# Patient Record
Sex: Male | Born: 1970 | Race: White | Hispanic: No | Marital: Single | Smoking: Current every day smoker
Health system: Southern US, Community
[De-identification: ages and names within clinical notes are randomized; demographics above are authoritative.]

## PROBLEM LIST (undated history)

## (undated) DIAGNOSIS — R51 Headache: Secondary | ICD-10-CM

## (undated) DIAGNOSIS — I10 Essential (primary) hypertension: Secondary | ICD-10-CM

## (undated) DIAGNOSIS — M1990 Unspecified osteoarthritis, unspecified site: Secondary | ICD-10-CM

## (undated) DIAGNOSIS — R519 Headache, unspecified: Secondary | ICD-10-CM

## (undated) DIAGNOSIS — F329 Major depressive disorder, single episode, unspecified: Secondary | ICD-10-CM

## (undated) DIAGNOSIS — R569 Unspecified convulsions: Secondary | ICD-10-CM

## (undated) DIAGNOSIS — F32A Depression, unspecified: Secondary | ICD-10-CM

## (undated) HISTORY — PX: BACK SURGERY: SHX140

## (undated) HISTORY — DX: Unspecified osteoarthritis, unspecified site: M19.90

## (undated) HISTORY — DX: Unspecified convulsions: R56.9

## (undated) HISTORY — PX: SHOULDER SURGERY: SHX246

## (undated) HISTORY — DX: Essential (primary) hypertension: I10

## (undated) HISTORY — DX: Headache: R51

## (undated) HISTORY — DX: Depression, unspecified: F32.A

## (undated) HISTORY — DX: Headache, unspecified: R51.9

## (undated) HISTORY — DX: Major depressive disorder, single episode, unspecified: F32.9

---

## 2007-01-12 ENCOUNTER — Emergency Department (HOSPITAL_COMMUNITY): Admission: EM | Admit: 2007-01-12 | Discharge: 2007-01-12 | Payer: Self-pay | Admitting: Family Medicine

## 2007-01-14 ENCOUNTER — Encounter: Admission: RE | Admit: 2007-01-14 | Discharge: 2007-01-14 | Payer: Self-pay | Admitting: Orthopedic Surgery

## 2007-01-14 ENCOUNTER — Emergency Department (HOSPITAL_COMMUNITY): Admission: EM | Admit: 2007-01-14 | Discharge: 2007-01-15 | Payer: Self-pay | Admitting: *Deleted

## 2012-11-24 ENCOUNTER — Ambulatory Visit: Payer: Self-pay

## 2012-12-10 DIAGNOSIS — I10 Essential (primary) hypertension: Secondary | ICD-10-CM

## 2012-12-10 HISTORY — DX: Essential (primary) hypertension: I10

## 2013-01-28 ENCOUNTER — Emergency Department: Payer: Self-pay

## 2013-01-28 LAB — COMPREHENSIVE METABOLIC PANEL
BUN: 7 mg/dL (ref 7–18)
Bilirubin,Total: 0.5 mg/dL (ref 0.2–1.0)
Chloride: 106 mmol/L (ref 98–107)
Co2: 31 mmol/L (ref 21–32)
Creatinine: 1.06 mg/dL (ref 0.60–1.30)
EGFR (African American): >60
EGFR (Non-African Amer.): >60
Glucose: 125 mg/dL — ABNORMAL HIGH (ref 65–99)
Osmolality: 283 (ref 275–301)
Potassium: 4.4 mmol/L (ref 3.5–5.1)

## 2013-01-28 LAB — DRUG SCREEN, URINE
Amphetamines, Ur Screen: NEGATIVE (ref ?–1000)
Barbiturates, Ur Screen: NEGATIVE (ref ?–200)
Cannabinoid 50 Ng, Ur ~~LOC~~: NEGATIVE (ref ?–50)
MDMA (Ecstasy)Ur Screen: NEGATIVE (ref ?–500)
Methadone, Ur Screen: NEGATIVE (ref ?–300)
Tricyclic, Ur Screen: NEGATIVE (ref ?–1000)

## 2013-01-28 LAB — CBC
HCT: 42.9 % (ref 40.0–52.0)
HGB: 14.6 g/dL (ref 13.0–18.0)
MCH: 33 pg (ref 26.0–34.0)
MCV: 97 fL (ref 80–100)
Platelet: 257 10*3/uL (ref 150–440)
RBC: 4.41 10*6/uL (ref 4.40–5.90)
RDW: 15.1 % — ABNORMAL HIGH (ref 11.5–14.5)

## 2013-01-28 LAB — URINALYSIS, COMPLETE
Bilirubin,UR: NEGATIVE
Blood: NEGATIVE
Leukocyte Esterase: NEGATIVE
Squamous Epithelial: NONE SEEN
WBC UR: NONE SEEN /HPF (ref 0–5)

## 2013-01-30 ENCOUNTER — Emergency Department: Payer: Self-pay | Admitting: Emergency Medicine

## 2013-02-17 ENCOUNTER — Ambulatory Visit: Payer: Self-pay | Admitting: Neurology

## 2013-02-20 ENCOUNTER — Ambulatory Visit: Payer: Self-pay | Admitting: Neurology

## 2013-04-29 ENCOUNTER — Ambulatory Visit: Payer: Self-pay

## 2013-09-04 ENCOUNTER — Emergency Department (HOSPITAL_COMMUNITY)
Admission: EM | Admit: 2013-09-04 | Discharge: 2013-09-04 | Payer: Managed Care, Other (non HMO) | Attending: Emergency Medicine | Admitting: Emergency Medicine

## 2013-09-04 ENCOUNTER — Encounter (HOSPITAL_COMMUNITY): Payer: Self-pay

## 2013-09-04 DIAGNOSIS — F329 Major depressive disorder, single episode, unspecified: Secondary | ICD-10-CM

## 2013-09-04 DIAGNOSIS — F17200 Nicotine dependence, unspecified, uncomplicated: Secondary | ICD-10-CM | POA: Insufficient documentation

## 2013-09-04 DIAGNOSIS — S61509A Unspecified open wound of unspecified wrist, initial encounter: Secondary | ICD-10-CM | POA: Insufficient documentation

## 2013-09-04 DIAGNOSIS — IMO0002 Reserved for concepts with insufficient information to code with codable children: Secondary | ICD-10-CM | POA: Insufficient documentation

## 2013-09-04 DIAGNOSIS — X838XXA Intentional self-harm by other specified means, initial encounter: Secondary | ICD-10-CM

## 2013-09-04 DIAGNOSIS — S61519A Laceration without foreign body of unspecified wrist, initial encounter: Secondary | ICD-10-CM

## 2013-09-04 DIAGNOSIS — Z0289 Encounter for other administrative examinations: Secondary | ICD-10-CM | POA: Insufficient documentation

## 2013-09-04 DIAGNOSIS — F39 Unspecified mood [affective] disorder: Secondary | ICD-10-CM

## 2013-09-04 DIAGNOSIS — X789XXA Intentional self-harm by unspecified sharp object, initial encounter: Secondary | ICD-10-CM | POA: Insufficient documentation

## 2013-09-04 DIAGNOSIS — R45851 Suicidal ideations: Secondary | ICD-10-CM | POA: Insufficient documentation

## 2013-09-04 LAB — CBC WITH DIFFERENTIAL/PLATELET
Basophils Absolute: 0.1 10*3/uL (ref 0.0–0.1)
Hemoglobin: 14.5 g/dL (ref 13.0–17.0)
MCH: 33.6 pg (ref 26.0–34.0)
MCHC: 34.4 g/dL (ref 30.0–36.0)
Neutro Abs: 3.7 10*3/uL (ref 1.7–7.7)
Neutrophils Relative %: 53 % (ref 43–77)
Platelets: 261 10*3/uL (ref 150–400)
RBC: 4.32 MIL/uL (ref 4.22–5.81)
WBC: 7 10*3/uL (ref 4.0–10.5)

## 2013-09-04 LAB — RAPID URINE DRUG SCREEN, HOSP PERFORMED
Amphetamines: NOT DETECTED
Barbiturates: NOT DETECTED
Cocaine: NOT DETECTED
Opiates: POSITIVE — AB

## 2013-09-04 LAB — POCT I-STAT, CHEM 8
Calcium, Ion: 1.11 mmol/L — ABNORMAL LOW (ref 1.12–1.23)
Glucose, Bld: 86 mg/dL (ref 70–99)
Potassium: 3.7 mEq/L (ref 3.5–5.1)
Sodium: 145 mEq/L (ref 135–145)
TCO2: 24 mmol/L (ref 0–100)

## 2013-09-04 MED ORDER — IBUPROFEN 800 MG PO TABS
800.0000 mg | ORAL_TABLET | Freq: Once | ORAL | Status: DC
  Filled 2013-09-04: qty 1

## 2013-09-04 MED ORDER — ZIPRASIDONE MESYLATE 20 MG IM SOLR: 20.0000 mg | Freq: Once | INTRAMUSCULAR | Status: DC

## 2013-09-04 MED ORDER — NICOTINE 21 MG/24HR TD PT24: 21.0000 mg | MEDICATED_PATCH | Freq: Every day | TRANSDERMAL | Status: DC

## 2013-09-04 MED ORDER — BACITRACIN ZINC 500 UNIT/GM EX OINT
1.0000 "application " | TOPICAL_OINTMENT | Freq: Two times a day (BID) | CUTANEOUS | Status: DC
  Administered 2013-09-04: 1 via TOPICAL
  Filled 2013-09-04: qty 0.9

## 2013-09-04 MED ORDER — LORAZEPAM 1 MG PO TABS: 1.0000 mg | ORAL_TABLET | Freq: Three times a day (TID) | ORAL | Status: DC | PRN

## 2013-09-04 MED ORDER — IBUPROFEN 200 MG PO TABS: 600.0000 mg | ORAL_TABLET | Freq: Three times a day (TID) | ORAL | Status: DC | PRN

## 2013-09-04 MED ORDER — ALUM & MAG HYDROXIDE-SIMETH 200-200-20 MG/5ML PO SUSP: 30.0000 mL | ORAL | Status: DC | PRN

## 2013-09-04 MED ORDER — ZOLPIDEM TARTRATE 5 MG PO TABS: 5.0000 mg | ORAL_TABLET | Freq: Every evening | ORAL | Status: DC | PRN

## 2013-09-04 MED ORDER — ONDANSETRON HCL 4 MG PO TABS: 4.0000 mg | ORAL_TABLET | Freq: Three times a day (TID) | ORAL | Status: DC | PRN

## 2013-09-04 MED ORDER — ACETAMINOPHEN 325 MG PO TABS: 650.0000 mg | ORAL_TABLET | ORAL | Status: DC | PRN

## 2013-09-04 NOTE — ED Provider Notes (Signed)
CSN: [id number]     Arrival date & time 09/04/13  0508 History   First MD Initiated Contact with Patient 09/04/13 [phone]     Chief Complaint  Patient presents with  . Medical Clearance  . Suicidal   (Consider location/radiation/quality/duration/timing/severity/associated sxs/prior Treatment) HPI Comments: Patient was brought in by [facility] Department with IVC papers that stage.  He is suicidal.  He locked himself in his home arms with weapons.  He has a laceration to his left wrist, approximately 3 cm long.  He has alcohol on his breath.  He, states it's all a misunderstanding.  The history is provided by the patient.    History reviewed. No pertinent past medical history. History reviewed. No pertinent past surgical history. History reviewed. No pertinent family history. History  Substance Use Topics  . Smoking status: Current Every Day Smoker  . Smokeless tobacco: Not on file  . Alcohol Use: Yes    Review of Systems  Constitutional: Negative for fever.  HENT: Positive for dental problem.   Skin: Positive for wound.  Neurological: Negative for dizziness, numbness and headaches.  All other systems reviewed and are negative.    Allergies  Review of patient's allergies indicates no known allergies.  Home Medications  No current outpatient prescriptions on file. BP 138/99  Pulse 100  Temp(Src) 98.9 F (37.2 C) (Oral)  Resp 18  SpO2 97% Physical Exam  Constitutional: He is oriented to person, place, and time. He appears well-developed and well-nourished.  HENT:  Head: Normocephalic.  Eyes: Pupils are equal, round, and reactive to light.  Neck: Normal range of motion.  Cardiovascular: Normal rate and regular rhythm.   Pulmonary/Chest: Effort normal.  Musculoskeletal: Normal range of motion. He exhibits tenderness. He exhibits no edema.  Neurological: He is alert and oriented to person, place, and time.  Skin: Skin is warm and dry.  Superficial scratch wounds to neck  of the ear bilaterally.  Laceration to the medial aspect of the left wrist, approximately 3 cm    ED Course  LACERATION REPAIR Date/Time: 09/04/2013 5:53 AM Performed by: Arman Filter Authorized by: Arman Filter Consent: Verbal consent obtained. written consent obtained. Risks and benefits: risks, benefits and alternatives were discussed Consent given by: patient Patient understanding: patient states understanding of the procedure being performed Patient identity confirmed: verbally with patient Body area: upper extremity Location details: left wrist Laceration length: 3 cm Foreign bodies: unknown Tendon involvement: none Nerve involvement: none Vascular damage: no Anesthesia: local infiltration Local anesthetic: lidocaine 1% without epinephrine Anesthetic total: 2 ml Patient sedated: no Preparation: Patient was prepped and draped in the usual sterile fashion. Irrigation solution: saline Irrigation method: syringe Amount of cleaning: standard Debridement: none Degree of undermining: none Skin closure: 5-0 Prolene Number of sutures: 4 Technique: simple Approximation: loose Approximation difficulty: simple Dressing: antibiotic ointment and gauze roll Patient tolerance: Patient tolerated the procedure well with no immediate complications.   (including critical care time) Labs Review Labs Reviewed  CBC WITH DIFFERENTIAL  ETHANOL  URINE RAPID DRUG SCREEN (HOSP PERFORMED)   Imaging Review No results found.  MDM  No diagnosis found.  Laceration sutures medical clearing labs have been ordered Patient in no distress handcuffs to R wrist with Sheriff at bedside     Arman Filter, NP 09/04/13 [phone]

## 2013-09-04 NOTE — BHH Counselor (Signed)
Therapist met with pt to complete assessment.  Pt uncooperative, posturing, demanding to leave.  No AVH or delusions present, denies SI/HI.  Pt picked up by LE after barricading himself in home with firearms.  Pt has laceration on left wrist.  IVC states pt was suicidal at time of pick up and smelled of ETOH.  Therapist unable to complete assessment with pt. Ena Dawley, MSW, LCSW, Herbalist

## 2013-09-04 NOTE — ED Notes (Signed)
Pt resistive to care while on SAPPU. Refused to answer assessment questions and refused to sign for D/C. D/C'd to the custody of [facility] deputies r/t outstanding warrants

## 2013-09-04 NOTE — ED Notes (Signed)
Patient notified that he needs to provide urine and will be cathed if he can not urinate.

## 2013-09-04 NOTE — ED Provider Notes (Signed)
Pt angry, agitated, tried to leave, walked to exit door, verbally de-escalated by myself, Pt is IVC, Pt aware he is IVC, await [facility] recs. [id number]  Hurman Horn, MD 09/05/13 [phone]

## 2013-09-04 NOTE — ED Notes (Signed)
Pt belongings: pair of jeans, hoodie, belk, socks, sneakers

## 2013-09-04 NOTE — BH Assessment (Signed)
Patient accepted to [facility] by Dahlia Byes NP. Attending MD Akintayo, bed #405-2.

## 2013-09-04 NOTE — ED Provider Notes (Signed)
Patient care assumed from Earley Favor, FNP at shift change.  8:20 - Patient presents by IVC after attempted suicide by cop. Awaiting urine. Once medically cleared, plan TTS consult.  Urine positive for opiates and benzos. Patient medically cleared and consult to TTS placed.  8:51 - Patient c/o dental pain from broken tooth; ibuprofen ordered for pain control.  3:30 - Patient denying suicidal ideations. Patient seen by psych ED doctor, Dr. Fonnie Jarvis, who will d/c patient to jail.  Antony Madura, PA-C 09/04/13 1549

## 2013-09-04 NOTE — ED Provider Notes (Signed)
Medical screening examination/treatment/procedure(s) were performed by non-physician practitioner and as supervising physician I was immediately available for consultation/collaboration.   Sherrica Niehaus R Jillyn Stacey, MD 09/04/13 0559 

## 2013-09-04 NOTE — ED Notes (Signed)
Bed: YN82 Expected date: 09/04/13 Expected time: 4:56 AM Means of arrival: Police Comments: IVC, etoh

## 2013-09-04 NOTE — ED Notes (Signed)
Pt is IVC by [employer], he stated that he was suicidal and was locked in his house armed with rifles and knives, he said he was going to come out with a knife so that he would get shot by police. Sheriff's dept was at a stand off with him since 10 pm.

## 2013-09-04 NOTE — ED Notes (Signed)
Psych physician and PA talked with the patient.

## 2013-09-04 NOTE — ED Notes (Signed)
In and out cath not done. Patient voided.

## 2013-09-04 NOTE — Consult Note (Signed)
[facility] Face-to-Face Psychiatry Consult   Reason for Consult:  Suicide Attempt Referring Physician:  EDP Juan Avila is an 42 y.o. male.  Assessment: AXIS I:  Suicide attempt, Mood d/o AXIS II:  Deferred AXIS III:  History reviewed. No pertinent past medical history. AXIS IV:  housing problems, other psychosocial or environmental problems, problems related to social environment and problems with primary support group AXIS V:  21-30 behavior considerably influenced by delusions or hallucinations OR serious impairment in judgment, communication OR inability to function in almost all areas  Plan:  Patient does not meet criteria for psychiatric inpatient admission.  Subjective:   Juan Avila is a 42 y.o. male patient admitted with Suicide ideation/attempt.  HPI:   Caucasian male was brought in early this am by the [facility] under IVC paper for holding himself in a locked room with guns and knives.  Patient also cut himself on his left wrist as a suicidal gesture.  Patient was uncooperative during this interview and was not forth coming with his answers.  Initially he denies suicide attempt and stated he did not have a gun and later he said he cut himself to " end it all"  When asked to explain he said he just found out that his girl friend of two years was cheating on him and he could not handle that.  He denied locking himself up with guns around him.  He does not have any mental illness diagnosis and does not take any medications.  He stated he has 3 children and does not want to kill himself.  He declined an admission and started getting agitated.  We will discharge him to the [facility] who will take him to jail where he will continue to receive medical and psychiatric care.  He denies SI/HI/AVH.  Patient was very uncooperative with this interview. HPI Elements:   Location:  [facility].  Past Psychiatric History: History reviewed. No pertinent past medical history.  reports that he has been smoking.  He  does not have any smokeless tobacco history on file. He reports that  drinks alcohol. His drug history is not on file. History reviewed. No pertinent family history.         Allergies:  No Known Allergies  ACT Assessment Complete:  No:   Past Psychiatric History: Diagnosis:  Depressive d/o, Suicide Attenpt  Hospitalizations:  denies  Outpatient Care:  denies  Substance Abuse Care:  denies  Self-Mutilation:  denies  Suicidal Attempts:  Yes, last night  Homicidal Behaviors:  denies   Violent Behaviors:  denies   Place of Residence:  unknown Marital Status:  seperated Employed/Unemployed:  employed Education:  unknown Family Supports:  [place] Objective: Blood pressure 155/99, pulse 88, temperature 98 F (36.7 C), temperature source Oral, resp. rate 16, SpO2 98.00%.There is no height or weight on file to calculate BMI. Results for orders placed during the hospital encounter of 09/04/13 (from the past 72 hour(s))  CBC WITH DIFFERENTIAL     Status: None   Collection Time    09/04/13  6:17 AM      Result Value Range   WBC 7.0  4.0 - 10.5 K/uL   RBC 4.32  4.22 - 5.81 MIL/uL   Hemoglobin 14.5  13.0 - 17.0 g/dL   HCT 11.9  14.7 - 82.9 %   MCV 97.7  78.0 - 100.0 fL   MCH 33.6  26.0 - 34.0 pg   MCHC 34.4  30.0 - 36.0 g/dL   RDW 13.6  11.5 - 15.5 %   Platelets 261  150 - 400 K/uL   Neutrophils Relative % 53  43 - 77 %   Neutro Abs 3.7  1.7 - 7.7 K/uL   Lymphocytes Relative 35  12 - 46 %   Lymphs Abs 2.5  0.7 - 4.0 K/uL   Monocytes Relative 10  3 - 12 %   Monocytes Absolute 0.7  0.1 - 1.0 K/uL   Eosinophils Relative 2  0 - 5 %   Eosinophils Absolute 0.1  0.0 - 0.7 K/uL   Basophils Relative 1  0 - 1 %   Basophils Absolute 0.1  0.0 - 0.1 K/uL  ETHANOL     Status: Abnormal   Collection Time    09/04/13  6:17 AM      Result Value Range   Alcohol, Ethyl (B) 104 (*) 0 - 11 mg/dL   Comment:            LOWEST DETECTABLE LIMIT FOR     SERUM ALCOHOL IS 11 mg/dL     FOR MEDICAL  PURPOSES ONLY  POCT I-STAT, CHEM 8     Status: Abnormal   Collection Time    09/04/13  6:25 AM      Result Value Range   Sodium 145  135 - 145 mEq/L   Potassium 3.7  3.5 - 5.1 mEq/L   Chloride 106  96 - 112 mEq/L   BUN 4 (*) 6 - 23 mg/dL   Creatinine, Ser 7.82  0.50 - 1.35 mg/dL   Glucose, Bld 86  70 - 99 mg/dL   Calcium, Ion 9.56 (*) 1.12 - 1.23 mmol/L   TCO2 24  0 - 100 mmol/L   Hemoglobin 15.3  13.0 - 17.0 g/dL   HCT 21.3  08.6 - 57.8 %  URINE RAPID DRUG SCREEN (HOSP PERFORMED)     Status: Abnormal   Collection Time    09/04/13  7:41 AM      Result Value Range   Opiates POSITIVE (*) NONE DETECTED   Cocaine NONE DETECTED  NONE DETECTED   Benzodiazepines POSITIVE (*) NONE DETECTED   Amphetamines NONE DETECTED  NONE DETECTED   Tetrahydrocannabinol NONE DETECTED  NONE DETECTED   Barbiturates NONE DETECTED  NONE DETECTED   Comment:            DRUG SCREEN FOR MEDICAL PURPOSES     ONLY.  IF CONFIRMATION IS NEEDED     FOR ANY PURPOSE, NOTIFY LAB     WITHIN 5 DAYS.                LOWEST DETECTABLE LIMITS     FOR URINE DRUG SCREEN     Drug Class       Cutoff (ng/mL)     Amphetamine      1000     Barbiturate      200     Benzodiazepine   200     Tricyclics       300     Opiates          300     Cocaine          300     THC              50   Labs are reviewed and are pertinent for UDS POSITIVE BENZODIAZEPINE, OpIATES, Alcohol abuse  Current Facility-Administered Medications  Medication Dose Route Frequency Provider Last Rate Last Dose  . acetaminophen (TYLENOL) tablet 650  mg  650 mg Oral Q4H PRN Antony Madura, PA-C      . alum & mag hydroxide-simeth (MAALOX/MYLANTA) 200-200-20 MG/5ML suspension 30 mL  30 mL Oral PRN Antony Madura, PA-C      . bacitracin ointment 1 application  1 application Topical BID Arman Filter, NP   1 application at 09/04/13 [id number]  . ibuprofen (ADVIL,MOTRIN) tablet 600 mg  600 mg Oral Q8H PRN Antony Madura, PA-C      . ibuprofen (ADVIL,MOTRIN) tablet 800 mg   800 mg Oral Once [employer], PA-C      . LORazepam (ATIVAN) tablet 1 mg  1 mg Oral Q8H PRN Antony Madura, PA-C      . nicotine (NICODERM CQ - dosed in mg/24 hours) patch 21 mg  21 mg Transdermal Daily Antony Madura, PA-C      . ondansetron [facility]) tablet 4 mg  4 mg Oral Q8H PRN Antony Madura, PA-C      . ziprasidone (GEODON) injection 20 mg  20 mg Intramuscular Once Earney Navy, NP      . zolpidem (AMBIEN) tablet 5 mg  5 mg Oral QHS PRN Antony Madura, PA-C       No current outpatient prescriptions on file.    Psychiatric Specialty Exam:     Blood pressure 155/99, pulse 88, temperature 98 F (36.7 C), temperature source Oral, resp. rate 16, SpO2 98.00%.There is no height or weight on file to calculate BMI.  General Appearance: Casual  Eye Contact::  Poor  Speech:  Normal Rate  Volume:  Normal  Mood:  Angry, Anxious, Depressed, Hopeless, Irritable and Worthless  Affect:  Congruent  Thought Process:  Coherent and Goal Directed  Orientation:  Full (Time, Place, and Person)  Thought Content:  NA  Suicidal Thoughts:  Yes.  with intent/plan  Homicidal Thoughts:  No  Memory:  Immediate;   Good Recent;   Good Remote;   Good  Judgement:  Poor  Insight:  Shallow  Psychomotor Activity:  Normal  Concentration:  Fair  Recall:  Fair  Akathisia:  NA  Handed:  Right  AIMS (if indicated):     Assets:  Desire for Improvement  Sleep:      Treatment Plan Summary:  Consult and face to face interview with Dr Ladona Ridgel Patient declined admission to our inpatient unit and is d/c to the jail taken by the [facility] where he will receive psychiatric and medical care. Daily contact with patient to assess and evaluate symptoms and progress in treatment  Dahlia Byes, C  PMHNP-BC 09/04/2013 2:39 PM

## 2013-09-04 NOTE — ED Notes (Signed)
Pt has an active warrant against him for a possible probation violation

## 2013-09-05 NOTE — ED Provider Notes (Signed)
Medical screening examination/treatment/procedure(s) were conducted as a shared visit with non-physician practitioner(s) and myself.  I personally evaluated the patient during the encounter  Hurman Horn, MD 09/05/13 [phone]

## 2013-09-08 NOTE — Consult Note (Signed)
Note reviewed and agreed with  

## 2014-11-13 IMAGING — CT CT HEAD WITHOUT CONTRAST
1 series · 16 of 30 positions shown, 20 images · non-contrast
Comparison: none

REASON FOR EXAM: transient confusion, syncope, dropping things
COMMENTS:

[Series 2: soft tissue · axial · 0.42mm/px · z∈[-179,-44]mm · 16 of 31 slices shown, 20 images]
[im 2/31  brain]
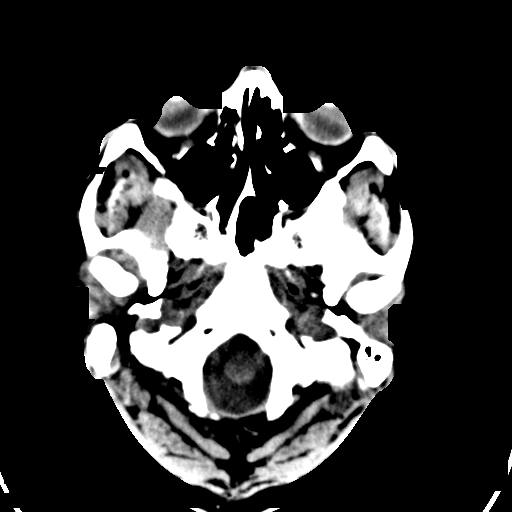
[im 2/31  bone]
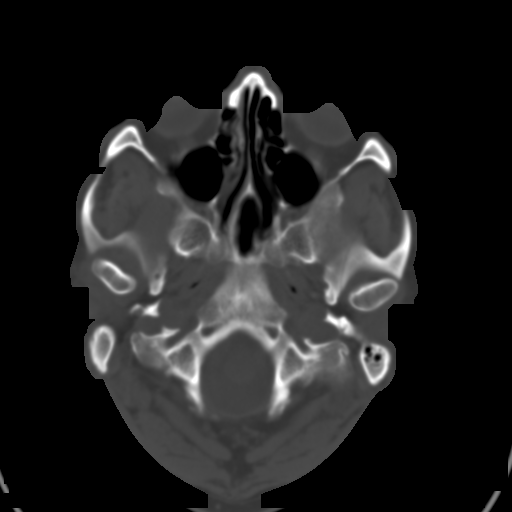
[im 4/31  brain]
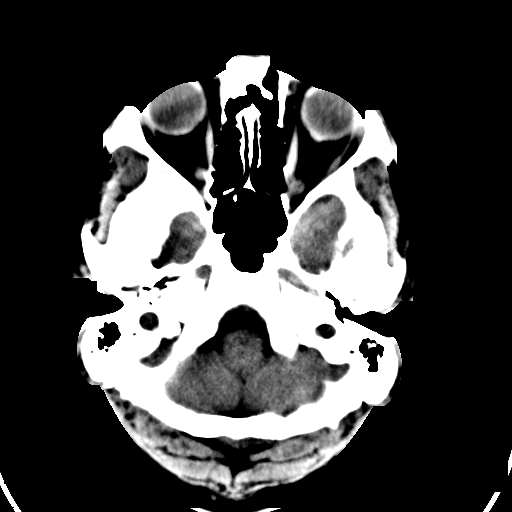
[im 6/31  brain]
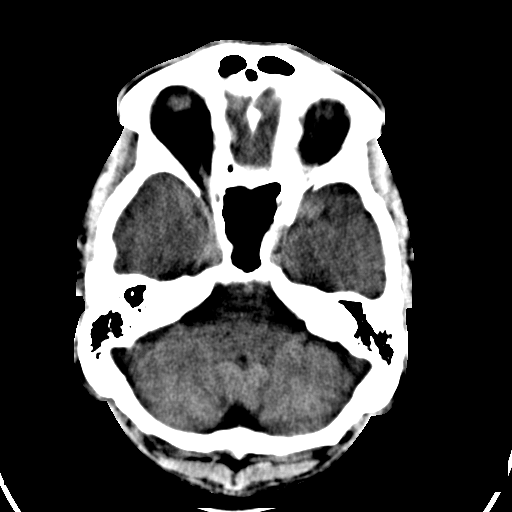
[im 8/31  brain]
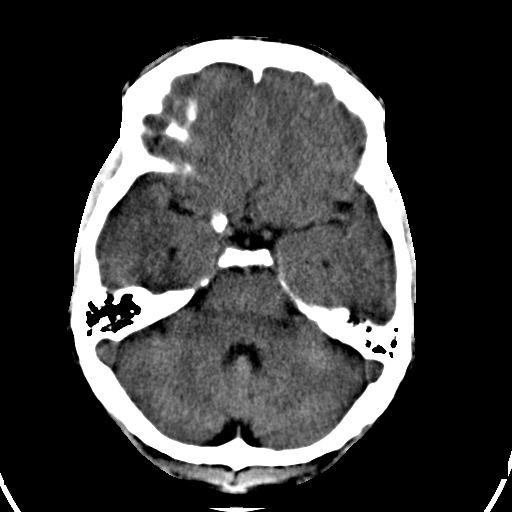
[im 9/31  brain]
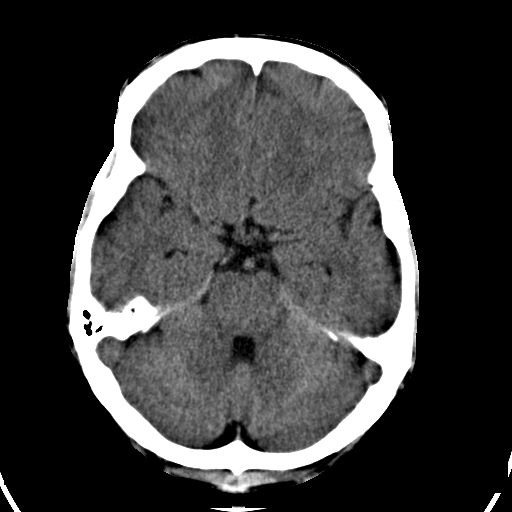
[im 9/31  bone]
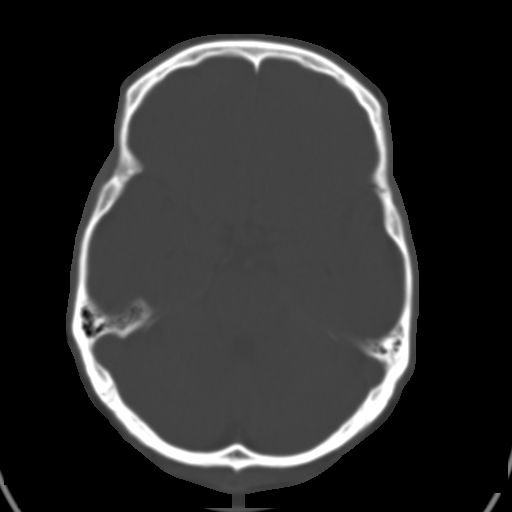
[im 11/31  brain]
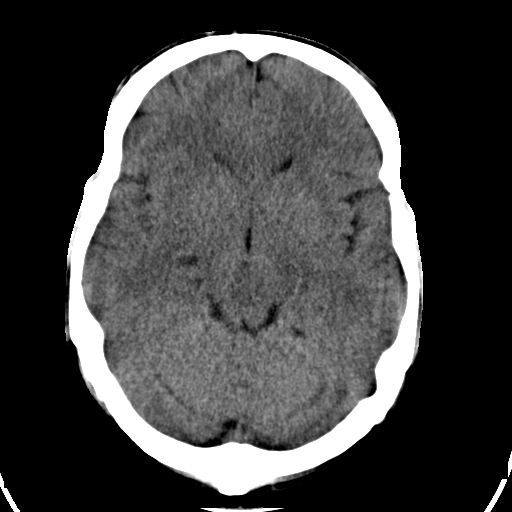
[im 13/31  brain]
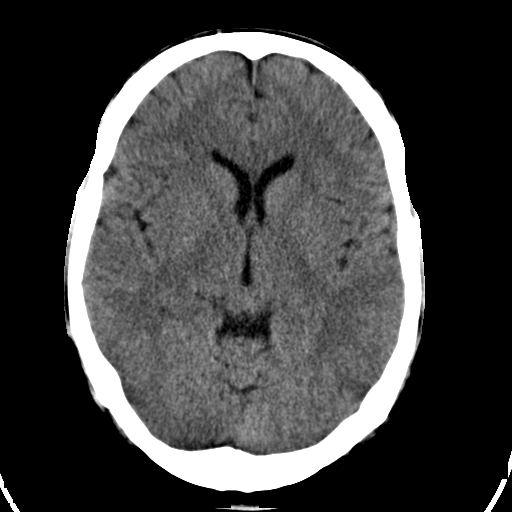
[im 15/31  brain]
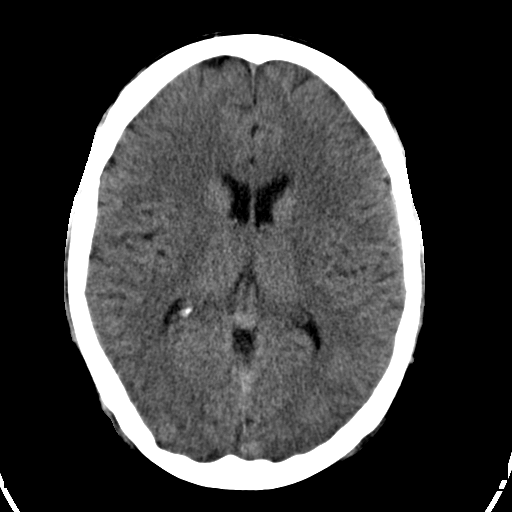
[im 16/31  brain]
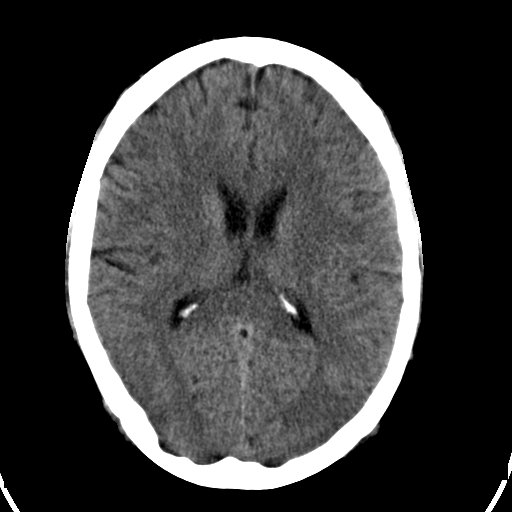
[im 16/31  bone]
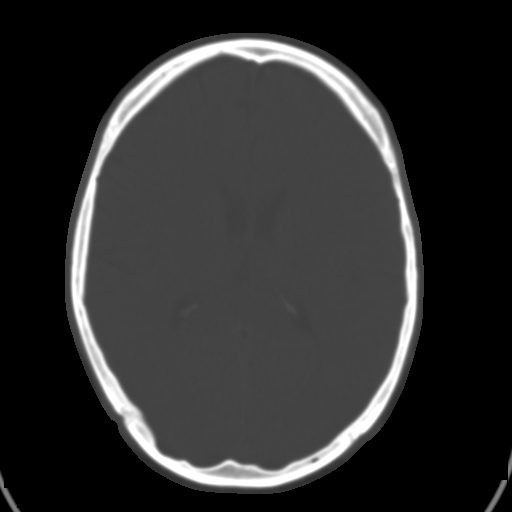
[im 18/31  brain]
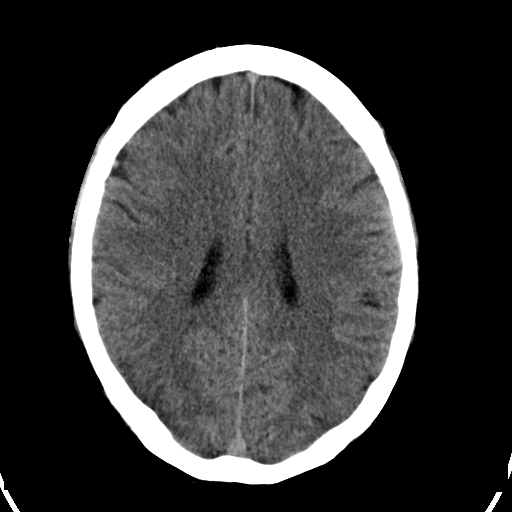
[im 20/31  brain]
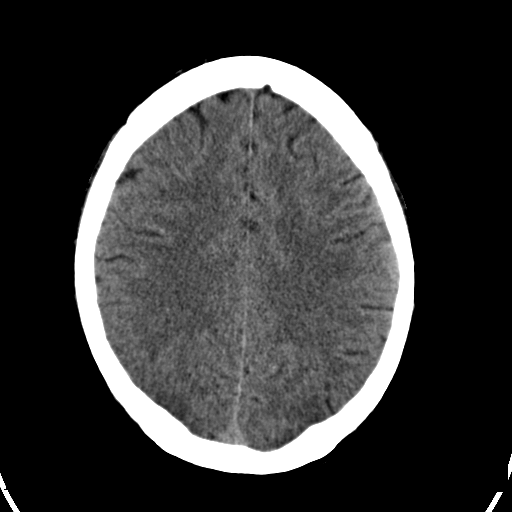
[im 22/31  brain]
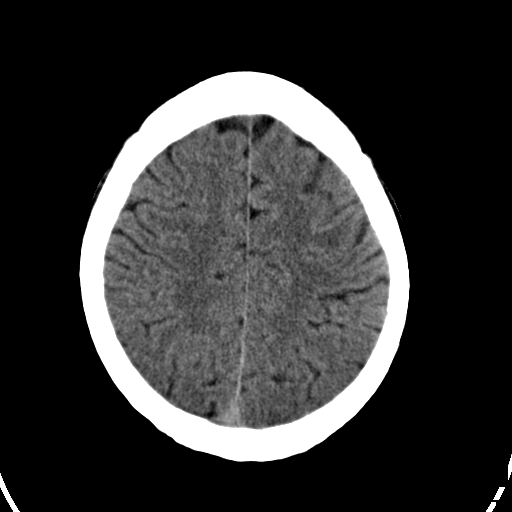
[im 23/31  brain]
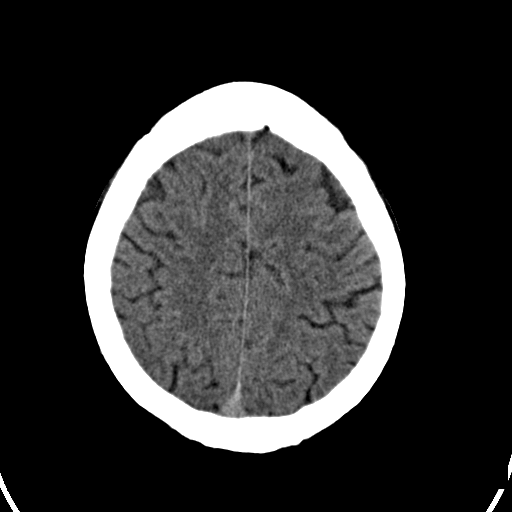
[im 23/31  bone]
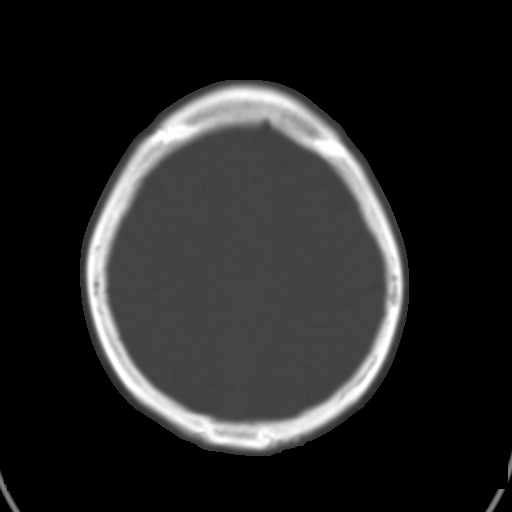
[im 25/31  brain]
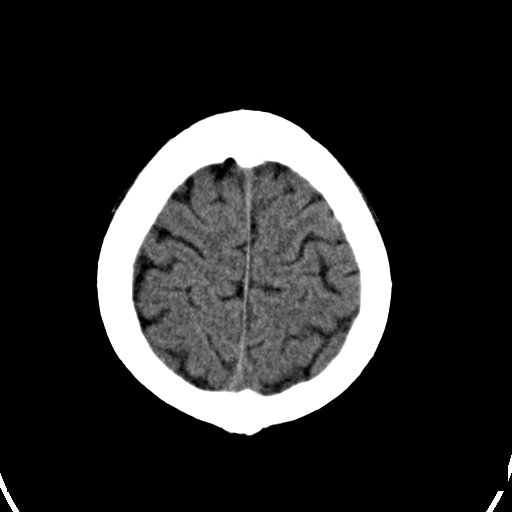
[im 27/31  brain]
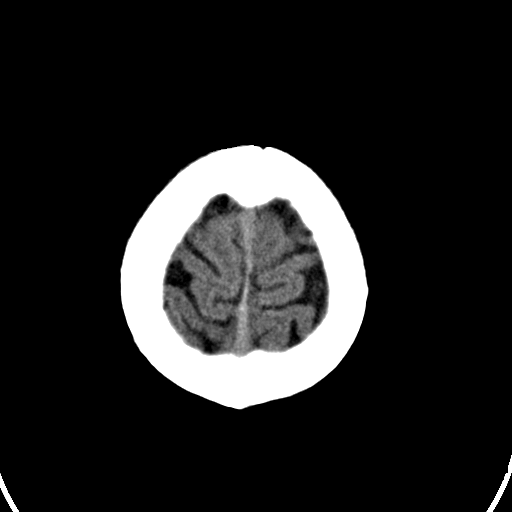
[im 29/31  brain]
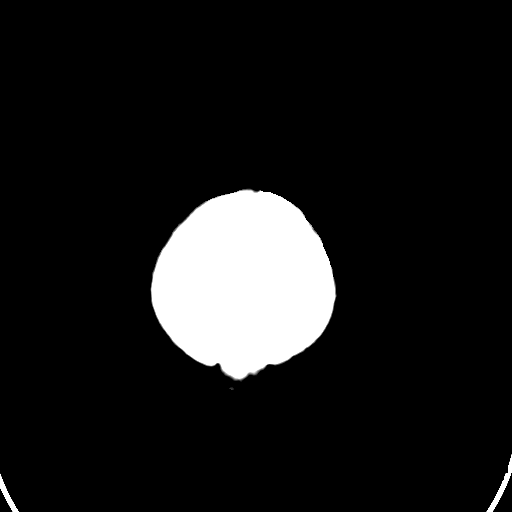

[16 of 30 positions shown; findings below may reference images not displayed]

PROCEDURE:     CT  - CT HEAD WITHOUT CONTRAST  - January 28, 2013  [DATE]

RESULT:     Noncontrast emergent CT of the brain is performed. The patient
has no previous exam for comparison.

The ventricles and sulci are normal. There is no hemorrhage. There is no
focal mass, mass-effect or midline shift. There is no evidence of edema or
territorial infarct. The bone windows demonstrate normal aeration of the
paranasal sinuses and mastoid air cells. There is no skull fracture
demonstrated.
IMPRESSION: 1. No acute intracranial abnormality.
2. Nonemergent followup MRI of the brain is recommended given the history .
The should be performed without and with gadolinium contra[REDACTED]

## 2014-11-13 IMAGING — CR DG CHEST 2V
1 series · 2 of 2 positions shown · non-contrast
Comparison: none

REASON FOR EXAM: pain R lower ribs s/p fall
COMMENTS:

[Series 1: w chest pa · 0.14mm/px · 2 of 2 slices shown]
[im 1/2]
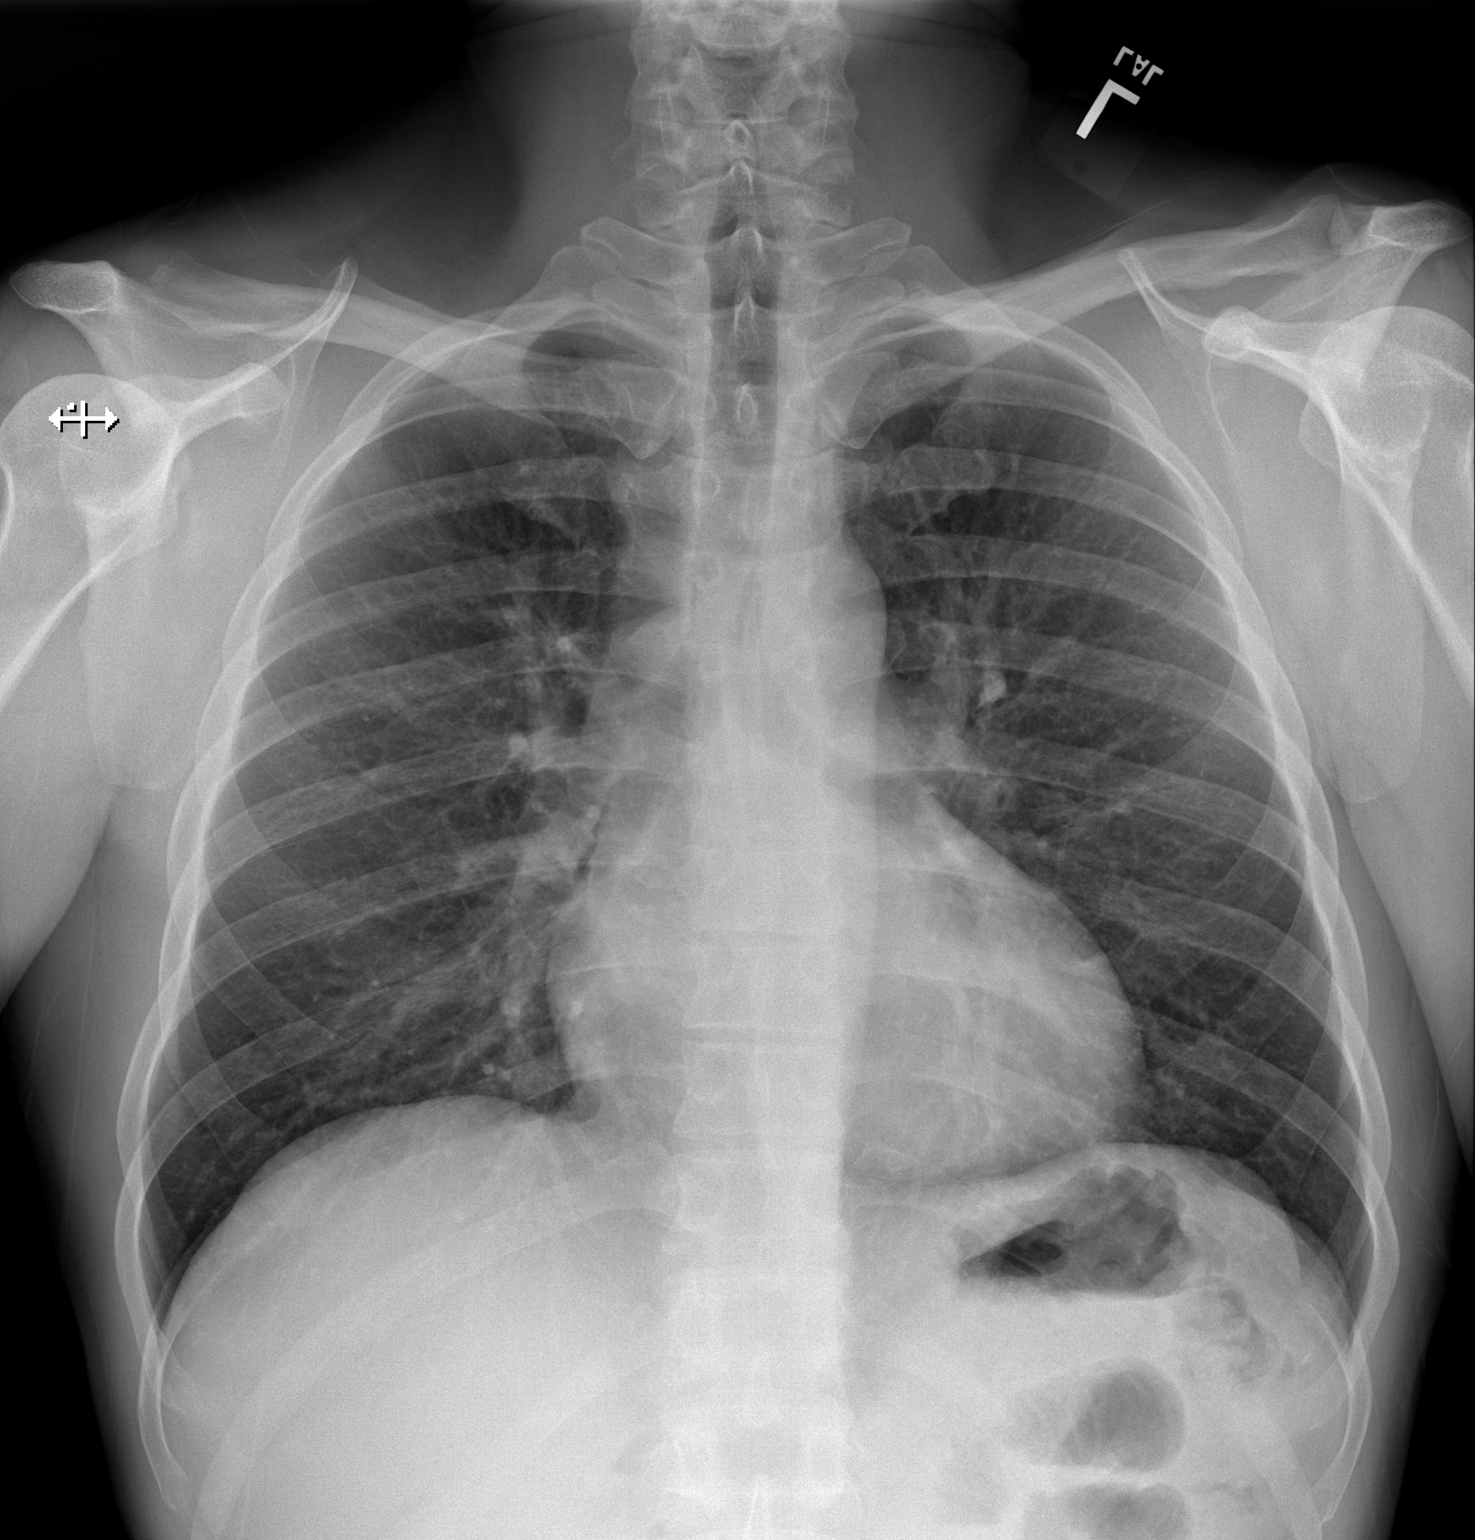
[im 2/2]
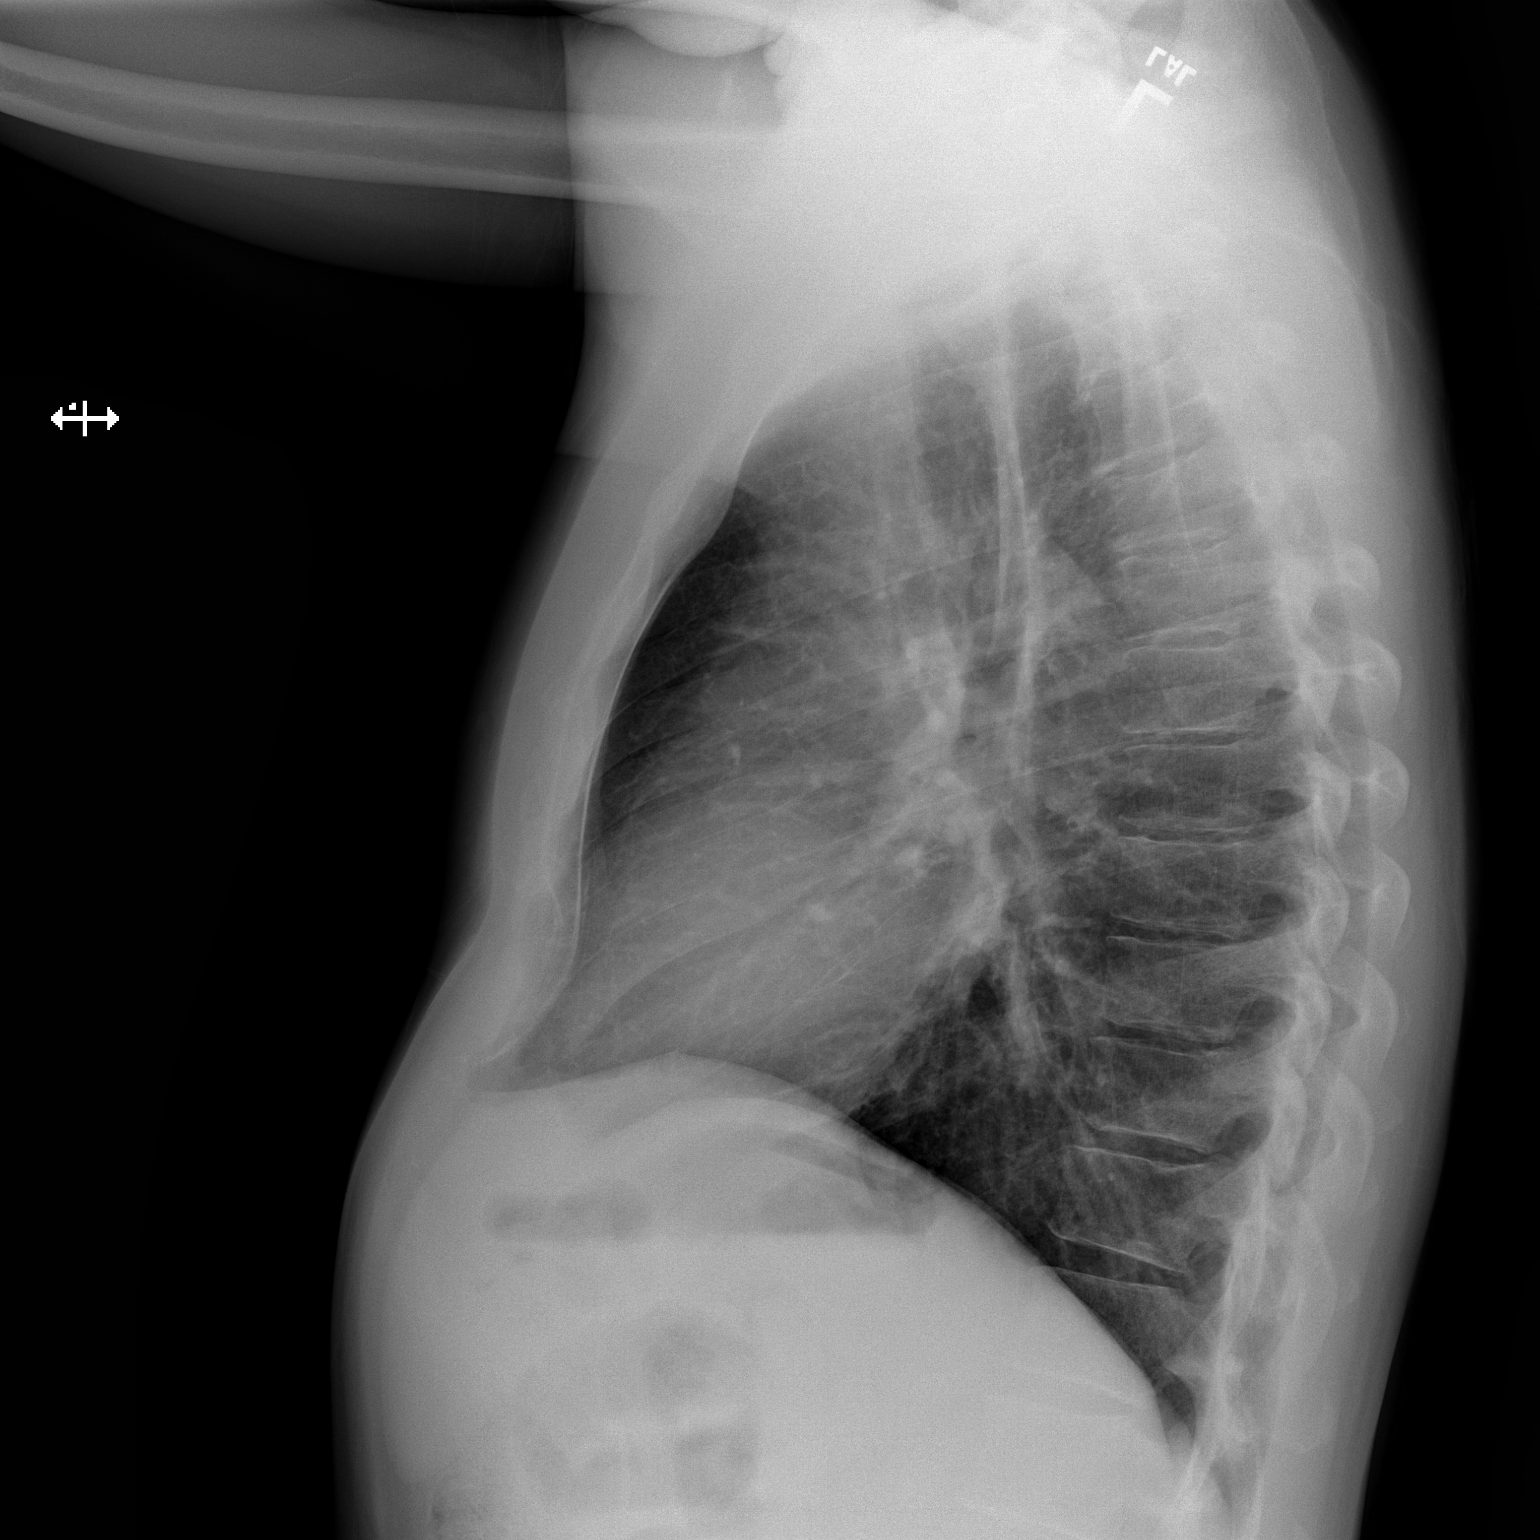

[2 of 2 positions shown; findings below may reference images not displayed]

PROCEDURE:     DXR - DXR CHEST PA (OR AP) AND LATERAL  - January 28, 2013  [DATE]

RESULT:     The lungs are well-expanded. There is no focal infiltrate or
evidence of a pulmonary contusion or pleural effusion. There is no
pneumothorax or pneumomediastinum. The cardiac silhouette is normal in size.
The pulmonary vascularity is not engorged. The bony thorax exhibits no acute
abnormality.
IMPRESSION: There is no evidence of acute cardiopulmonary abnormality.
The observed portions of the bony thorax appear intact.

[REDACTED]

## 2015-06-17 ENCOUNTER — Ambulatory Visit (INDEPENDENT_AMBULATORY_CARE_PROVIDER_SITE_OTHER): Payer: Self-pay | Admitting: Physician Assistant

## 2015-06-17 ENCOUNTER — Encounter: Payer: Self-pay | Admitting: Physician Assistant

## 2015-06-17 VITALS — BP 160/100 | HR 82 | Temp 98.5°F | Resp 18 | Ht 70.0 in | Wt 162.2 lb

## 2015-06-17 DIAGNOSIS — I10 Essential (primary) hypertension: Secondary | ICD-10-CM | POA: Insufficient documentation

## 2015-06-17 DIAGNOSIS — Z720 Tobacco use: Secondary | ICD-10-CM | POA: Insufficient documentation

## 2015-06-17 MED ORDER — HYDROCHLOROTHIAZIDE 12.5 MG PO TABS: 12.5000 mg | ORAL_TABLET | Freq: Every day | ORAL | Status: DC

## 2015-06-17 NOTE — Patient Instructions (Signed)
Low-Sodium Eating Plan Sodium raises blood pressure and causes water to be held in the body. Getting less sodium from food will help lower your blood pressure, reduce any swelling, and protect your heart, liver, and kidneys. We get sodium by adding salt (sodium chloride) to food. Most of our sodium comes from canned, boxed, and frozen foods. Restaurant foods, fast foods, and pizza are also very high in sodium. Even if you take medicine to lower your blood pressure or to reduce fluid in your body, getting less sodium from your food is important. WHAT IS MY PLAN? Most people should limit their sodium intake to 2,300 mg a day. Your health care provider recommends that you limit your sodium intake to __________ a day.  WHAT DO I NEED TO KNOW ABOUT THIS EATING PLAN? For the low-sodium eating plan, you will follow these general guidelines:  Choose foods with a % Daily Value for sodium of less than 5% (as listed on the food label).   Use salt-free seasonings or herbs instead of table salt or sea salt.   Check with your health care provider or pharmacist before using salt substitutes.   Eat fresh foods.  Eat more vegetables and fruits.  Limit canned vegetables. If you do use them, rinse them well to decrease the sodium.   Limit cheese to 1 oz (28 g) per day.   Eat lower-sodium products, often labeled as "lower sodium" or "no salt added."  Avoid foods that contain monosodium glutamate (MSG). MSG is sometimes added to [place] food and some canned foods.  Check food labels (Nutrition Facts labels) on foods to learn how much sodium is in one serving.  Eat more home-cooked food and less restaurant, buffet, and fast food.  When eating at a restaurant, ask that your food be prepared with less salt or none, if possible.  HOW DO I READ FOOD LABELS FOR SODIUM INFORMATION? The Nutrition Facts label lists the amount of sodium in one serving of the food. If you eat more than one serving, you must  multiply the listed amount of sodium by the number of servings. Food labels may also identify foods as:  Sodium free--Less than 5 mg in a serving.  Very low sodium--35 mg or less in a serving.  Low sodium--140 mg or less in a serving.  Light in sodium--50% less sodium in a serving. For example, if a food that usually has 300 mg of sodium is changed to become light in sodium, it will have 150 mg of sodium.  Reduced sodium--25% less sodium in a serving. For example, if a food that usually has 400 mg of sodium is changed to reduced sodium, it will have 300 mg of sodium. WHAT FOODS CAN I EAT? Grains Low-sodium cereals, including oats, puffed wheat and rice, and shredded wheat cereals. Low-sodium crackers. Unsalted rice and pasta. Lower-sodium bread.  Vegetables Frozen or fresh vegetables. Low-sodium or reduced-sodium canned vegetables. Low-sodium or reduced-sodium tomato sauce and paste. Low-sodium or reduced-sodium tomato and vegetable juices.  Fruits Fresh, frozen, and canned fruit. Fruit juice.  Meat and Other Protein Products Low-sodium canned tuna and salmon. Fresh or frozen meat, poultry, seafood, and fish. Lamb. Unsalted nuts. Dried beans, peas, and lentils without added salt. Unsalted canned beans. Homemade soups without salt. Eggs.  Dairy Milk. Soy milk. Ricotta cheese. Low-sodium or reduced-sodium cheeses. Yogurt.  Condiments Fresh and dried herbs and spices. Salt-free seasonings. Onion and garlic powders. Low-sodium varieties of mustard and ketchup. Lemon juice.  Fats and Oils  Reduced-sodium salad dressings. Unsalted butter.  Other Unsalted popcorn and pretzels.  The items listed above may not be a complete list of recommended foods or beverages. Contact your dietitian for more options. WHAT FOODS ARE NOT RECOMMENDED? Grains Instant hot cereals. Bread stuffing, pancake, and biscuit mixes. Croutons. Seasoned rice or pasta mixes. Noodle soup cups. Boxed or frozen  macaroni and cheese. Self-rising flour. Regular salted crackers. Vegetables Regular canned vegetables. Regular canned tomato sauce and paste. Regular tomato and vegetable juices. Frozen vegetables in sauces. Salted french fries. Olives. Rosita Fire. Relishes. Sauerkraut. Salsa. Meat and Other Protein Products Salted, canned, smoked, spiced, or pickled meats, seafood, or fish. Bacon, ham, sausage, hot dogs, corned beef, chipped beef, and packaged luncheon meats. Salt pork. Jerky. Pickled herring. Anchovies, regular canned tuna, and sardines. Salted nuts. Dairy Processed cheese and cheese spreads. Cheese curds. Blue cheese and cottage cheese. Buttermilk.  Condiments Onion and garlic salt, seasoned salt, table salt, and sea salt. Canned and packaged gravies. Worcestershire sauce. Tartar sauce. Barbecue sauce. Teriyaki sauce. Soy sauce, including reduced sodium. Steak sauce. Fish sauce. Oyster sauce. Cocktail sauce. Horseradish. Regular ketchup and mustard. Meat flavorings and tenderizers. Bouillon cubes. Hot sauce. Tabasco sauce. Marinades. Taco seasonings. Relishes. Fats and Oils Regular salad dressings. Salted butter. Margarine. Ghee. Bacon fat.  Other Potato and tortilla chips. Corn chips and puffs. Salted popcorn and pretzels. Canned or dried soups. Pizza. Frozen entrees and pot pies.  The items listed above may not be a complete list of foods and beverages to avoid. Contact your dietitian for more information. Document Released: 05/18/2002 Document Revised: 12/01/2013 Document Reviewed: 09/30/2013 [facility] Patient Information 2015 [place], [place]. This information is not intended to replace advice given to you by your health care provider. Make sure you discuss any questions you have with your health care provider. Hypertension Hypertension, commonly called high blood pressure, is when the force of blood pumping through your arteries is too strong. Your arteries are the blood vessels that  carry blood from your heart throughout your body. A blood pressure reading consists of a higher number over a lower number, such as 110/72. The higher number (systolic) is the pressure inside your arteries when your heart pumps. The lower number (diastolic) is the pressure inside your arteries when your heart relaxes. Ideally you want your blood pressure below 120/80. Hypertension forces your heart to work harder to pump blood. Your arteries may become narrow or stiff. Having hypertension puts you at risk for heart disease, stroke, and other problems.  RISK FACTORS Some risk factors for high blood pressure are controllable. Others are not.  Risk factors you cannot control include:   Race. You may be at higher risk if you are African American.  Age. Risk increases with age.  Gender. Men are at higher risk than women before age 37 years. After age 57, women are at higher risk than men. Risk factors you can control include:  Not getting enough exercise or physical activity.  Being overweight.  Getting too much fat, sugar, calories, or salt in your diet.  Drinking too much alcohol. SIGNS AND SYMPTOMS Hypertension does not usually cause signs or symptoms. Extremely high blood pressure (hypertensive crisis) may cause headache, anxiety, shortness of breath, and nosebleed. DIAGNOSIS  To check if you have hypertension, your health care provider will measure your blood pressure while you are seated, with your arm held at the level of your heart. It should be measured at least twice using the same arm. Certain conditions can cause  a difference in blood pressure between your right and left arms. A blood pressure reading that is higher than normal on one occasion does not mean that you need treatment. If one blood pressure reading is high, ask your health care provider about having it checked again. TREATMENT  Treating high blood pressure includes making lifestyle changes and possibly taking medicine.  Living a healthy lifestyle can help lower high blood pressure. You may need to change some of your habits. Lifestyle changes may include:  Following the DASH diet. This diet is high in fruits, vegetables, and whole grains. It is low in salt, red meat, and added sugars.  Getting at least 2 hours of brisk physical activity every week.  Losing weight if necessary.  Not smoking.  Limiting alcoholic beverages.  Learning ways to reduce stress. If lifestyle changes are not enough to get your blood pressure under control, your health care provider may prescribe medicine. You may need to take more than one. Work closely with your health care provider to understand the risks and benefits. HOME CARE INSTRUCTIONS  Have your blood pressure rechecked as directed by your health care provider.   Take medicines only as directed by your health care provider. Follow the directions carefully. Blood pressure medicines must be taken as prescribed. The medicine does not work as well when you skip doses. Skipping doses also puts you at risk for problems.   Do not smoke.   Monitor your blood pressure at home as directed by your health care provider. SEEK MEDICAL CARE IF:   You think you are having a reaction to medicines taken.  You have recurrent headaches or feel dizzy.  You have swelling in your ankles.  You have trouble with your vision. SEEK IMMEDIATE MEDICAL CARE IF:  You develop a severe headache or confusion.  You have unusual weakness, numbness, or feel faint.  You have severe chest or abdominal pain.  You vomit repeatedly.  You have trouble breathing. MAKE SURE YOU:   Understand these instructions.  Will watch your condition.  Will get help right away if you are not doing well or get worse. Document Released: 11/26/2005 Document Revised: 04/12/2014 Document Reviewed: 09/18/2013 [facility] Patient Information 2015 [place], [place]. This information is not intended to replace  advice given to you by your health care provider. Make sure you discuss any questions you have with your health care provider.

## 2015-06-17 NOTE — Progress Notes (Signed)
Subjective:    Patient ID: Juan Avila, male    DOB: [birth year], 44 y.o.   MRN: [id number]  Hypertension This is a new problem. The current episode started more than 1 year ago. The problem is unchanged. The problem is uncontrolled. Associated symptoms include anxiety, headaches and sweats. Pertinent negatives include no blurred vision, chest pain, malaise/fatigue, neck pain, orthopnea, palpitations, peripheral edema, PND or shortness of breath. There are no associated agents to hypertension. Risk factors for coronary artery disease include family history, male gender, smoking/tobacco exposure and stress. Treatments tried: He has tried anti-hypertensives in the past but had no relief of symptoms.  He does not remember which ones he tried. The current treatment provides no improvement. Compliance problems include diet and exercise.  There is no history of angina, kidney disease, CAD/MI, CVA, heart failure, left ventricular hypertrophy, PVD, renovascular disease, retinopathy or a thyroid problem. There is no history of chronic renal disease, coarctation of the aorta, hyperaldosteronism, hypercortisolism, hyperparathyroidism, a hypertension causing med, pheochromocytoma or sleep apnea.      Review of Systems  Constitutional: Positive for diaphoresis. Negative for fever, chills, malaise/fatigue, activity change, appetite change, fatigue and unexpected weight change.  Eyes: Negative.  Negative for blurred vision.  Respiratory: Negative for cough, chest tightness, shortness of breath and wheezing.   Cardiovascular: Negative for chest pain, palpitations, orthopnea, leg swelling and PND.  Endocrine: Negative.   Genitourinary: Negative.   Musculoskeletal: Negative.  Negative for neck pain.  Skin: Negative.   Neurological: Positive for light-headedness and headaches.  Hematological: Negative.   Psychiatric/Behavioral: The patient is nervous/anxious.        Objective:   Physical Exam   Constitutional: He is oriented to person, place, and time. He appears well-developed and well-nourished. No distress.  HENT:  Head: Normocephalic and atraumatic.  Eyes: Conjunctivae are normal. Pupils are equal, round, and reactive to light. Right eye exhibits no discharge. Left eye exhibits no discharge.  Neck: Normal range of motion. Neck supple. No JVD present. No thyromegaly present.  Cardiovascular: Normal rate, regular rhythm, normal heart sounds and intact distal pulses.  Exam reveals no gallop and no friction rub.   No murmur heard. Pulmonary/Chest: Effort normal and breath sounds normal. No respiratory distress. He has no wheezes. He has no rales. He exhibits no tenderness.  Neurological: He is alert and oriented to person, place, and time.  Skin: He is not diaphoretic.  Psychiatric: He has a normal mood and affect. His behavior is normal. Judgment and thought content normal.  Vitals reviewed.         Assessment & Plan:  1. Essential hypertension Has known history of this.  Has previously tried some anti-hypertensives but he could not remember which ones.  Will start with HCTZ.  He is to keep a log of his blood pressures.  If BP still elevated in 2 weeks will increase to 2 tabs daily.  Will recheck in 4 weeks.  If still elevated will add lisinopril.  He is still without insurance and would like to hold off until Sept 2016 when his insurance takes effect for further testing.  He does have a strong family history of early CAD with his grandmother passing at 37 from an MI and an aunt passing two weeks ago at 70 from a stroke.  In September we will get an EKG and labs and proceed forward depending on those results. - hydrochlorothiazide (HYDRODIURIL) 12.5 MG tablet; Take 1 tablet (12.5 mg total) by mouth daily.  Dispense: 30 tablet; Refill: 1  2. Tobacco abuse Discussed the importance of smoking cessation, especially with high blood pressure and family history.  He voiced understanding  but is not ready to quit currently.

## 2015-07-12 ENCOUNTER — Other Ambulatory Visit: Payer: Self-pay | Admitting: Physician Assistant

## 2015-07-12 DIAGNOSIS — I10 Essential (primary) hypertension: Secondary | ICD-10-CM

## 2015-07-12 NOTE — Telephone Encounter (Signed)
Pt contacted office for refill request on the following medications:  HYDROCHLOROTHIAZIDE 12.5 MG CP.  [facility].  CB#[phone]/MW  Pt states she has been taking 2 pills a day as you advised/MW

## 2015-07-12 NOTE — Telephone Encounter (Signed)
Please refill med listed below. Thanks!

## 2015-07-13 MED ORDER — HYDROCHLOROTHIAZIDE 25 MG PO TABS: 25.0000 mg | ORAL_TABLET | Freq: Every day | ORAL | Status: DC

## 2015-07-13 NOTE — Telephone Encounter (Signed)
Please advise patient med was refilled as  so he will only need to take one pill daily now instead of 2.  Sent to [facility], [place].  Thanks!

## 2015-07-13 NOTE — Telephone Encounter (Signed)
LMTCB, left a message advising patient that RX reflecting a dose increase was sent to pharmacy and he only needs to take one tablet daily now.

## 2015-07-15 ENCOUNTER — Ambulatory Visit: Payer: Self-pay | Admitting: Physician Assistant

## 2015-11-09 ENCOUNTER — Other Ambulatory Visit: Payer: Self-pay | Admitting: Physician Assistant

## 2015-11-09 ENCOUNTER — Encounter: Payer: Self-pay | Admitting: Physician Assistant

## 2015-11-09 ENCOUNTER — Ambulatory Visit (INDEPENDENT_AMBULATORY_CARE_PROVIDER_SITE_OTHER): Payer: BLUE CROSS/BLUE SHIELD | Admitting: Physician Assistant

## 2015-11-09 VITALS — BP 130/90 | HR 100 | Temp 98.3°F | Resp 16 | Ht 70.0 in | Wt 165.4 lb

## 2015-11-09 DIAGNOSIS — Z136 Encounter for screening for cardiovascular disorders: Secondary | ICD-10-CM | POA: Diagnosis not present

## 2015-11-09 DIAGNOSIS — IMO0001 Reserved for inherently not codable concepts without codable children: Secondary | ICD-10-CM

## 2015-11-09 DIAGNOSIS — Z8669 Personal history of other diseases of the nervous system and sense organs: Secondary | ICD-10-CM

## 2015-11-09 DIAGNOSIS — M545 Low back pain, unspecified: Secondary | ICD-10-CM

## 2015-11-09 DIAGNOSIS — I10 Essential (primary) hypertension: Secondary | ICD-10-CM

## 2015-11-09 DIAGNOSIS — Z13220 Encounter for screening for lipoid disorders: Secondary | ICD-10-CM | POA: Diagnosis not present

## 2015-11-09 DIAGNOSIS — Z0000 Encounter for general adult medical examination without abnormal findings: Secondary | ICD-10-CM

## 2015-11-09 DIAGNOSIS — Z125 Encounter for screening for malignant neoplasm of prostate: Secondary | ICD-10-CM

## 2015-11-09 DIAGNOSIS — Z87898 Personal history of other specified conditions: Secondary | ICD-10-CM | POA: Insufficient documentation

## 2015-11-09 DIAGNOSIS — E291 Testicular hypofunction: Secondary | ICD-10-CM

## 2015-11-09 MED ORDER — CYCLOBENZAPRINE HCL 10 MG PO TABS: 10.0000 mg | ORAL_TABLET | Freq: Every day | ORAL | Status: DC

## 2015-11-09 MED ORDER — MELOXICAM 15 MG PO TABS
15.0000 mg | ORAL_TABLET | Freq: Every day | ORAL | Status: DC
Start: 2015-11-09 — End: 2023-03-08
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
## 2015-11-09 NOTE — Patient Instructions (Signed)

## 2015-11-11 LAB — COMPREHENSIVE METABOLIC PANEL
A/G RATIO: 1.5 (ref 1.1–2.5)
ALK PHOS: 89 IU/L (ref 39–117)
ALT: 31 IU/L (ref 0–44)
AST: 29 IU/L (ref 0–40)
Albumin: 4.4 g/dL (ref 3.5–5.5)
BILIRUBIN TOTAL: 0.4 mg/dL (ref 0.0–1.2)
BUN / CREAT RATIO: 6 — AB (ref 9–20)
BUN: 6 mg/dL (ref 6–24)
CHLORIDE: 98 mmol/L (ref 97–106)
CO2: 27 mmol/L (ref 18–29)
Calcium: 9.8 mg/dL (ref 8.7–10.2)
Creatinine, Ser: 0.95 mg/dL (ref 0.76–1.27)
GFR calc non Af Amer: 98 mL/min/{1.73_m2} (ref >59)
GFR, EST AFRICAN AMERICAN: 113 mL/min/{1.73_m2} (ref >59)
Globulin, Total: 3 g/dL (ref 1.5–4.5)
Glucose: 95 mg/dL (ref 65–99)
POTASSIUM: 4.9 mmol/L (ref 3.5–5.2)
Sodium: 140 mmol/L (ref 136–144)
TOTAL PROTEIN: 7.4 g/dL (ref 6.0–8.5)

## 2015-11-11 LAB — LIPID PANEL
CHOL/HDL RATIO: 2.6 ratio (ref 0.0–5.0)
Cholesterol, Total: 199 mg/dL (ref 100–199)
HDL: 77 mg/dL (ref >39)
LDL Calculated: 102 mg/dL — ABNORMAL HIGH (ref 0–99)
TRIGLYCERIDES: 101 mg/dL (ref 0–149)
VLDL CHOLESTEROL CAL: 20 mg/dL (ref 5–40)

## 2015-11-11 LAB — CBC WITH DIFFERENTIAL/PLATELET
Basophils Absolute: 0.1 10*3/uL (ref 0.0–0.2)
Basos: 2 %
EOS (ABSOLUTE): 0.1 10*3/uL (ref 0.0–0.4)
Eos: 1 %
HEMATOCRIT: 48.6 % (ref 37.5–51.0)
Hemoglobin: 17 g/dL (ref 12.6–17.7)
IMMATURE GRANULOCYTES: 0 %
Immature Grans (Abs): 0 10*3/uL (ref 0.0–0.1)
LYMPHS ABS: 2.7 10*3/uL (ref 0.7–3.1)
Lymphs: 39 %
MCH: 33.8 pg — AB (ref 26.6–33.0)
MCHC: 35 g/dL (ref 31.5–35.7)
MCV: 97 fL (ref 79–97)
MONOS ABS: 0.8 10*3/uL (ref 0.1–0.9)
Monocytes: 12 %
NEUTROS ABS: 3.1 10*3/uL (ref 1.4–7.0)
Neutrophils: 46 %
Platelets: 314 10*3/uL (ref 150–379)
RBC: 5.03 x10E6/uL (ref 4.14–5.80)
RDW: 12.8 % (ref 12.3–15.4)
WBC: 6.8 10*3/uL (ref 3.4–10.8)

## 2015-11-11 LAB — TESTOSTERONE: TESTOSTERONE: 1091 ng/dL (ref 348–1197)

## 2015-11-11 LAB — PSA: Prostate Specific Ag, Serum: 2.6 ng/mL (ref 0.0–4.0)

## 2015-11-11 LAB — TSH: TSH: 1.08 u[IU]/mL (ref 0.450–4.500)

## 2015-11-14 ENCOUNTER — Telehealth: Payer: Self-pay

## 2015-11-14 NOTE — Telephone Encounter (Signed)
LMTCB  Thanks,  -Octavie Westerhold 

## 2015-11-14 NOTE — Telephone Encounter (Signed)
-----   Message from Margaretann LovelessJennifer M Burnette, [place]PA-C sent at 11/14/2015  8:40 AM EST ----- All labs are within normal limits and stable including testosterone. Thanks! -JB

## 2015-11-14 NOTE — Telephone Encounter (Signed)
Patient advised as directed below.  Thanks,  -Joseline 

## 2015-11-18 ENCOUNTER — Encounter: Payer: Self-pay | Admitting: Physician Assistant

## 2015-11-18 ENCOUNTER — Ambulatory Visit (INDEPENDENT_AMBULATORY_CARE_PROVIDER_SITE_OTHER): Payer: BLUE CROSS/BLUE SHIELD | Admitting: Physician Assistant

## 2015-11-18 VITALS — BP 148/88 | HR 115 | Temp 98.8°F | Resp 16 | Wt 171.4 lb

## 2015-11-18 DIAGNOSIS — K047 Periapical abscess without sinus: Secondary | ICD-10-CM

## 2015-11-18 MED ORDER — AMOXICILLIN-POT CLAVULANATE 875-125 MG PO TABS: 1.0000 | ORAL_TABLET | Freq: Two times a day (BID) | ORAL | Status: DC

## 2015-11-18 NOTE — Patient Instructions (Signed)
Dental Abscess A dental abscess is pus in or around a tooth. HOME CARE  Take medicines only as told by your dentist.  If you were prescribed antibiotic medicine, finish all of it even if you start to feel better.  Rinse your mouth (gargle) often with salt water.  Do not drive or use heavy machinery, like a lawn mower, while taking pain medicine.  Do not apply heat to the outside of your mouth.  Keep all follow-up visits as told by your dentist. This is important. GET HELP IF:  Your pain is worse, and medicine does not help. GET HELP RIGHT AWAY IF:  You have a fever or chills.  Your symptoms suddenly get worse.  You have a very bad headache.  You have problems breathing or swallowing.  You have trouble opening your mouth.  You have puffiness (swelling) in your neck or around your eye.   This information is not intended to replace advice given to you by your health care provider. Make sure you discuss any questions you have with your health care provider.   Document Released: 04/12/2015 Document Reviewed: 04/12/2015 Elsevier Interactive Patient Education 2016 [employer]  

## 2015-11-18 NOTE — Progress Notes (Signed)
Patient: Juan Avila Male    DOB: 07-08-1971   44 y.o.   MRN: [id number] Visit Date: 11/18/2015  Today's Provider: Margaretann LovelessJennifer M Burnette, PA-C   Chief Complaint  Patient presents with  . Facial Swelling   Subjective:    HPI Face Swelling: Patient is here today concern for face swelling and tooth ache. Patient went to the consultation yesterday for dental surgery with Dr. Duanne Guessewey. Was prescribed Triazolam and prednisone that is to be started prior to his tooth extraction procedure. He states that they're trying to remove all of his teeth and eventually consider getting a denture plate. At this time he cannot afford such a procedure. It was going to cost him approximately $10,000 total for both the tooth extraction and creation of the denture plate. He states that he did call Dr. Chauncy Passyewey's office again yesterday evening and informed them that he could not afford such procedure. He states that he asked them to look into how much it would cost only remove the teeth that are bothering him and causing pain at this time instead of removing all of his teeth. He has not yet heard back from their office.  After seeing Dr.Dewey and having his mouth worked on yesterday afternoon, last night he noticed his upper lip and cheek on the left side were swollen and very tender to touch. He has not had a fever, chills, nausea or vomiting. He does have severe dental caries and teeth that are cracked and rotting.     No Known Allergies Previous Medications   CYCLOBENZAPRINE (FLEXERIL) 10 MG TABLET    Take 1 tablet (10 mg total) by mouth at bedtime.   HYDROCHLOROTHIAZIDE (HYDRODIURIL) 25 MG TABLET    TAKE 1 TABLET (25 MG TOTAL) BY MOUTH DAILY.   MELOXICAM (MOBIC) 15 MG TABLET    Take 1 tablet (15 mg total) by mouth daily.    Review of Systems  Constitutional: Negative for fever and chills.  HENT: Positive for dental problem and facial swelling. Negative for congestion, drooling, ear pain, mouth sores,  rhinorrhea, sinus pressure, sore throat and trouble swallowing.   Respiratory: Negative.   Cardiovascular: Negative.   Gastrointestinal: Negative for nausea and vomiting.  Neurological: Positive for headaches.    Social History  Substance Use Topics  . Smoking status: Current Every Day Smoker -- 1.50 packs/day for 27 years    Types: Cigarettes  . Smokeless tobacco: Not on file  . Alcohol Use: 1.8 - 2.4 oz/week    3-4 Standard drinks or equivalent per week   Objective:   BP 148/88 mmHg  Pulse 115  Temp(Src) 98.8 F (37.1 C) (Oral)  Resp 16  Wt 171 lb 6.4 oz (77.747 kg)  Physical Exam  Constitutional: He appears well-developed and well-nourished. No distress.  HENT:  Head: Normocephalic and atraumatic.  Mouth/Throat: Uvula is midline, oropharynx is clear and moist and mucous membranes are normal. Abnormal dentition (there are teeth cracked and rotting). Dental caries (all teeth) present. No dental abscesses (There is no apperent infection noted but the gum line behind the upper incisisors looks abnormal). No oropharyngeal exudate.  Neck: Normal range of motion. Neck supple. No tracheal deviation present. No thyromegaly present.  Cardiovascular: Normal rate, regular rhythm and normal heart sounds.  Exam reveals no gallop.   No murmur heard. Pulmonary/Chest: Effort normal and breath sounds normal. No respiratory distress. He has no wheezes. He has no rales.  Lymphadenopathy:    He has no  cervical adenopathy.  Skin: He is not diaphoretic.  Vitals reviewed.       Assessment & Plan:     1. Tooth infection I did give him Augmentin as below in case the dental work yesterday aggravated his gumline and teeth. There was no apparent infection that I can see on exam but with the facial swelling and pain I felt it was safe to go ahead and treat as if there were an infection. I also advised him to continue to take Aleve as needed for pain and inflammation. I feel the work that he had done  yesterday and manipulation from the dentist has caused irritation of his gumline and his weight is causing the pain and inflammation. I also discussed with him how he may also be having some sensitivity to the cold weather being that he does have teeth that are cracked and most likely have nerve root exposed to the elements. He is to call the office if he has any worsening symptoms including fever, chills, nausea or vomiting or worsening pain. He voiced understanding. I also advised him to call Dr. Chauncy Passy office back to see if it would be possible for him to only have teeth removed that are causing him pain at this time to reduce cost. He agreed to do so. - amoxicillin-clavulanate (AUGMENTIN) 875-125 MG tablet; Take 1 tablet by mouth 2 (two) times daily.  Dispense: 20 tablet; Refill: 0       Margaretann Loveless, PA-C  [facility]

## 2016-05-09 ENCOUNTER — Ambulatory Visit: Payer: BLUE CROSS/BLUE SHIELD | Admitting: Physician Assistant

## 2016-05-20 ENCOUNTER — Emergency Department
Admission: EM | Admit: 2016-05-20 | Discharge: 2016-05-20 | Disposition: A | Discharge status: Home / Self Care | Payer: Managed Care, Other (non HMO) | Attending: Emergency Medicine | Admitting: Emergency Medicine

## 2016-05-20 ENCOUNTER — Encounter: Payer: Self-pay | Admitting: Emergency Medicine

## 2016-05-20 DIAGNOSIS — I10 Essential (primary) hypertension: Secondary | ICD-10-CM | POA: Insufficient documentation

## 2016-05-20 DIAGNOSIS — X58XXXA Exposure to other specified factors, initial encounter: Secondary | ICD-10-CM | POA: Insufficient documentation

## 2016-05-20 DIAGNOSIS — Y939 Activity, unspecified: Secondary | ICD-10-CM | POA: Insufficient documentation

## 2016-05-20 DIAGNOSIS — F329 Major depressive disorder, single episode, unspecified: Secondary | ICD-10-CM | POA: Insufficient documentation

## 2016-05-20 DIAGNOSIS — S025XXA Fracture of tooth (traumatic), initial encounter for closed fracture: Secondary | ICD-10-CM | POA: Insufficient documentation

## 2016-05-20 DIAGNOSIS — Z79899 Other long term (current) drug therapy: Secondary | ICD-10-CM | POA: Insufficient documentation

## 2016-05-20 DIAGNOSIS — Y999 Unspecified external cause status: Secondary | ICD-10-CM | POA: Insufficient documentation

## 2016-05-20 DIAGNOSIS — M1990 Unspecified osteoarthritis, unspecified site: Secondary | ICD-10-CM | POA: Insufficient documentation

## 2016-05-20 DIAGNOSIS — F17210 Nicotine dependence, cigarettes, uncomplicated: Secondary | ICD-10-CM | POA: Insufficient documentation

## 2016-05-20 DIAGNOSIS — K0889 Other specified disorders of teeth and supporting structures: Secondary | ICD-10-CM

## 2016-05-20 DIAGNOSIS — Z8669 Personal history of other diseases of the nervous system and sense organs: Secondary | ICD-10-CM | POA: Insufficient documentation

## 2016-05-20 DIAGNOSIS — Y929 Unspecified place or not applicable: Secondary | ICD-10-CM | POA: Insufficient documentation

## 2016-05-20 MED ORDER — IBUPROFEN 800 MG PO TABS: 800.0000 mg | ORAL_TABLET | Freq: Three times a day (TID) | ORAL | Status: DC | PRN

## 2016-05-20 MED ORDER — BUPIVACAINE HCL 0.5 % IJ SOLN: 5.0000 mL | Freq: Once | INTRAMUSCULAR | Status: DC

## 2016-05-20 MED ORDER — PENICILLIN V POTASSIUM 500 MG PO TABS: 500.0000 mg | ORAL_TABLET | Freq: Four times a day (QID) | ORAL | Status: DC

## 2016-05-20 MED ORDER — TRAMADOL HCL 50 MG PO TABS: 50.0000 mg | ORAL_TABLET | Freq: Four times a day (QID) | ORAL | Status: DC | PRN

## 2016-05-20 MED ORDER — BUPIVACAINE HCL (PF) 0.5 % IJ SOLN
INTRAMUSCULAR | Status: AC
  Filled 2016-05-20: qty 30

## 2016-05-20 MED ORDER — LIDOCAINE HCL (PF) 1 % IJ SOLN
5.0000 mL | Freq: Once | INTRAMUSCULAR | Status: DC
  Filled 2016-05-20: qty 5

## 2016-05-20 NOTE — ED Provider Notes (Signed)
CSN: [id number]     Arrival date & time 05/20/16  1456 History   First MD Initiated Contact with Patient 05/20/16 1527     Chief Complaint  Patient presents with  . Dental Pain     (Consider location/radiation/quality/duration/timing/severity/associated sxs/prior Treatment) HPI  45 year old male presents to emergency department for evaluation of dental pain. He said dental pain for 1 week that has increased over the last 2 days. Patient has a history of multiple dental caries. He denies any fevers, difficulty swallowing. His pain is localized to the back lower left tooth. Pain is throbbing. No relief with Tylenol or ibuprofen. He denies any vomiting.   Past Medical History  Diagnosis Date  . Arthritis     "n shoulders'  . Hypertension 2014    "Dr. Lennice SitesPope dx patient, pt is no longer on medicine.  . Seizures (HCC) onset in 2006     first seizure occurred after trip to [place] in 2006 for work where he contracted malaria  . Depression   . Headache    Past Surgical History  Procedure Laterality Date  . Shoulder surgery Right     first surgery was arounf 12 years ago, second surgery in 2007  . Back surgery      in 1992   Family History  Problem Relation Age of Onset  . Hypertension Mother   . CVA Maternal Aunt 54    cause of death  . Hypertension Maternal Grandmother   . Heart disease Maternal Grandmother   . Heart attack Maternal Grandmother 32    cause of death   Social History  Substance Use Topics  . Smoking status: Current Every Day Smoker -- 1.50 packs/day for 27 years    Types: Cigarettes  . Smokeless tobacco: None  . Alcohol Use: 1.8 - 2.4 oz/week    3-4 Standard drinks or equivalent per week    Review of Systems  Constitutional: Negative.  Negative for fever, chills, activity change and appetite change.  HENT: Positive for dental problem. Negative for congestion, drooling, ear pain, facial swelling, mouth sores, rhinorrhea, sinus pressure, sore throat, trouble  swallowing and voice change.   Eyes: Negative for photophobia, pain and discharge.  Respiratory: Negative for cough, chest tightness and shortness of breath.   Cardiovascular: Negative for chest pain and leg swelling.  Gastrointestinal: Negative for nausea, vomiting, abdominal pain, diarrhea and abdominal distention.  Genitourinary: Negative for dysuria and difficulty urinating.  Musculoskeletal: Negative for back pain, arthralgias, gait problem, neck pain and neck stiffness.  Skin: Negative.  Negative for color change and rash.  Neurological: Negative for dizziness and headaches.  Hematological: Negative for adenopathy.  Psychiatric/Behavioral: Negative for behavioral problems, confusion and agitation.  All other systems reviewed and are negative.     Allergies  Review of patient's allergies indicates no known allergies.  Home Medications   Prior to Admission medications   Medication Sig Start Date End Date Taking? Authorizing Provider  amoxicillin-clavulanate (AUGMENTIN) 875-125 MG tablet Take 1 tablet by mouth 2 (two) times daily. 11/18/15   Margaretann LovelessJennifer M Burnette, PA-C  cyclobenzaprine (FLEXERIL) 10 MG tablet Take 1 tablet (10 mg total) by mouth at bedtime. 11/09/15   Margaretann LovelessJennifer M Burnette, PA-C  hydrochlorothiazide (HYDRODIURIL) 25 MG tablet TAKE 1 TABLET (25 MG TOTAL) BY MOUTH DAILY. 11/09/15   Margaretann LovelessJennifer M Burnette, PA-C  ibuprofen (ADVIL,MOTRIN) 800 MG tablet Take 1 tablet (800 mg total) by mouth every 8 (eight) hours as needed. 05/20/16   Evon Slackhomas C Debbe Crumble, PA-C  meloxicam (MOBIC) 15 MG tablet Take 1 tablet (15 mg total) by mouth daily. 11/09/15   Margaretann Loveless, PA-C  penicillin v potassium (VEETID) 500 MG tablet Take 1 tablet (500 mg total) by mouth 4 (four) times daily. 05/20/16   Evon Slack, PA-C  traMADol (ULTRAM) 50 MG tablet Take 1 tablet (50 mg total) by mouth every 6 (six) hours as needed. 05/20/16   Evon Slack, PA-C   BP 170/105 mmHg  Pulse 93  Temp(Src) 98.5 F  (36.9 C) (Oral)  Resp 20  Ht  (1.778 m)  Wt 77.111 kg  BMI 24.39 kg/m2  SpO2 98% Physical Exam  Constitutional: He is oriented to person, place, and time. He appears well-developed and well-nourished. No distress.  HENT:  Head: Normocephalic and atraumatic.  Right Ear: External ear normal.  Left Ear: External ear normal.  Nose: Nose normal.  Mouth/Throat: Uvula is midline and oropharynx is clear and moist. No oral lesions. No trismus in the jaw. Normal dentition. Dental abscesses and dental caries present. No uvula swelling.    Eyes: Conjunctivae and EOM are normal. Pupils are equal, round, and reactive to light.  Neck: Normal range of motion. Neck supple.  Cardiovascular: Normal rate, regular rhythm, normal heart sounds and intact distal pulses.  Exam reveals no gallop and no friction rub.   No murmur heard. Pulmonary/Chest: Effort normal and breath sounds normal. No respiratory distress. He has no wheezes. He has no rales. He exhibits no tenderness.  Abdominal: Soft. Bowel sounds are normal. He exhibits no distension. There is no tenderness.  Musculoskeletal: Normal range of motion. He exhibits no edema or tenderness.  Neurological: He is alert and oriented to person, place, and time.  Skin: Skin is warm and dry.  Psychiatric: He has a normal mood and affect. His behavior is normal. Judgment and thought content normal.    ED Course  Procedures (including critical care time) Patient agreed and consented to a left mandibular nerve block. Patient was injected with 5 cc of 1% lidocaine and 5 cc of 0.5% bupivacaine. Patient tolerated procedure well.   Labs Review Labs Reviewed - No data to display  Imaging Review No results found. I have personally reviewed and evaluated these images and lab results as part of my medical decision-making.   EKG Interpretation None      MDM   Final diagnoses:  Pain, dental  Fractured tooth, closed, initial encounter    45 year old  male with left lower mandibular dental pain 7 days increasing over the last 2 days. He has multiple dental caries with cracked teeth. Patient is placed on oral antibiotics and tramadol. He agreed and consented to a left mandibular nerve block. Patient will follow-up with dental clinic.    Evon Slack, PA-C 05/20/16 1617  Jene Every, MD 05/20/16 [phone]

## 2016-05-20 NOTE — Discharge Instructions (Signed)
Tooth Injuries °Tooth injuries (tooth trauma) include cracked or broken teeth (fractures), teeth that have been moved out of place or dislodged (luxations), and knocked-out teeth (avulsions). °A tooth injury often needs to be treated quickly to save the tooth. However, sometimes it is not possible to save a tooth after an injury, so the tooth may need to be removed (extracted). °CAUSES °Tooth injuries may be caused by any force that is strong enough to chip, break, dislodge, or knock out a tooth. Forces may be due to: °· Sports injuries. °· Falls. °· Accidents. °· Fights. °RISK FACTORS °You may be more likely to injure a tooth if you play a contact sport without using a mouthguard. °SYMPTOMS °A tooth that is forced into the gum may appear dislodged or moved out of position into the tooth socket. A fractured tooth may not be as obvious. Symptoms of a tooth injury include: °· Pain, especially with chewing. °· A loose tooth. °· Bleeding in or around the tooth. °· Swelling or bruising near the tooth. °· Swelling or bruising of the lip over the injured tooth. °· Increased sensitivity to heat and cold. °DIAGNOSIS °A tooth injury can be diagnosed with a medical history and a physical exam. You may also need dental X-rays to check for injuries to the root of the tooth. °TREATMENT °Treatment depends on the type of injury you have and how bad it is. Treatment may need to be done quickly to save your tooth. Possible treatments include: °· Replacing a tooth fragment with a filling, cap, or hard, protective cover (crown). This may be an option for a chip or fracture that does not involve the inside of your tooth (pulp). °· Having a procedure to repair the inside of the tooth (root canal) and then having a crown placed on top. This may be done to treat a tooth fracture that involves the pulp. °· Repositioning a dislodged tooth, then doing a root canal. The root canal usually needs to be done within a few days of the  injury. °· Replacing a knocked-out tooth in the socket, if possible, then doing a root canal a few weeks later. °· Tooth extraction for a fracture that extends below your gumline or splits your tooth completely. °HOME CARE INSTRUCTIONS °· Take medicines only as directed by your dental provider or health care provider. °· Keep all follow-up visits as directed by your dental provider or health care provider. This is important. °· Do not eat or chew on very hard objects. These include ice cubes, pens, pencils, hard candy, and popcorn kernels. °· Do not clench or grind your teeth. Tell your dental provider or health care provider if you grind your teeth while you sleep. °· Apply ice to your mouth near the injured tooth as directed by your dental provider or health care provider. °· Follow instructions about rinsing your mouth with salt water as directed by your dental provider or health care provider. °· Do not use your teeth to open packages. °· Always wear mouth protection when you play contact sports. °SEEK MEDICAL CARE IF: °· You continue to have tooth pain after a tooth injury. °· Your tooth is sensitive to heat and cold. °· You develop swelling near your injured tooth. °· You have a fever. °· You are unable to open your jaw. °· You are drooling and it is getting worse. °  °This information is not intended to replace advice given to you by your health care provider. Make sure you discuss any   questions you have with your health care provider. °  °Document Released: 08/23/2004 Document Revised: 04/12/2015 Document Reviewed: 11/22/2014 °Elsevier Interactive Patient Education ©2016 [employer] ° °

## 2016-05-20 NOTE — ED Notes (Signed)
Patient presents to the ED with dental pain x 1 week.

## 2016-05-20 NOTE — ED Notes (Signed)
Patient states pain is worse after numbing injections. Patient refused to consult with PA.

## 2021-05-01 ENCOUNTER — Other Ambulatory Visit: Payer: Self-pay

## 2021-05-01 ENCOUNTER — Emergency Department
Admission: EM | Admit: 2021-05-01 | Discharge: 2021-05-01 | Disposition: A | Discharge status: Home / Self Care | Payer: Managed Care, Other (non HMO) | Attending: Emergency Medicine | Admitting: Emergency Medicine

## 2021-05-01 ENCOUNTER — Encounter: Payer: Self-pay | Admitting: Emergency Medicine

## 2021-05-01 DIAGNOSIS — F17210 Nicotine dependence, cigarettes, uncomplicated: Secondary | ICD-10-CM | POA: Insufficient documentation

## 2021-05-01 DIAGNOSIS — R000 Tachycardia, unspecified: Secondary | ICD-10-CM | POA: Insufficient documentation

## 2021-05-01 DIAGNOSIS — Z79899 Other long term (current) drug therapy: Secondary | ICD-10-CM | POA: Insufficient documentation

## 2021-05-01 DIAGNOSIS — A770 Spotted fever due to Rickettsia rickettsii: Secondary | ICD-10-CM | POA: Insufficient documentation

## 2021-05-01 DIAGNOSIS — I10 Essential (primary) hypertension: Secondary | ICD-10-CM | POA: Insufficient documentation

## 2021-05-01 LAB — CBC WITH DIFFERENTIAL/PLATELET
Abs Immature Granulocytes: 0.06 10*3/uL (ref 0.00–0.07)
Basophils Absolute: 0.1 10*3/uL (ref 0.0–0.1)
Basophils Relative: 0 %
Eosinophils Absolute: 0.1 10*3/uL (ref 0.0–0.5)
Eosinophils Relative: 1 %
HCT: 49.3 % (ref 39.0–52.0)
Hemoglobin: 17.8 g/dL — ABNORMAL HIGH (ref 13.0–17.0)
Immature Granulocytes: 0 %
Lymphocytes Relative: 7 %
Lymphs Abs: 1 10*3/uL (ref 0.7–4.0)
MCH: 34.8 pg — ABNORMAL HIGH (ref 26.0–34.0)
MCHC: 36.1 g/dL — ABNORMAL HIGH (ref 30.0–36.0)
MCV: 96.3 fL (ref 80.0–100.0)
Monocytes Absolute: 1.2 10*3/uL — ABNORMAL HIGH (ref 0.1–1.0)
Monocytes Relative: 8 %
Neutro Abs: 11.9 10*3/uL — ABNORMAL HIGH (ref 1.7–7.7)
Neutrophils Relative %: 84 %
Platelets: 213 10*3/uL (ref 150–400)
RBC: 5.12 MIL/uL (ref 4.22–5.81)
RDW: 12.3 % (ref 11.5–15.5)
WBC: 14.2 10*3/uL — ABNORMAL HIGH (ref 4.0–10.5)
nRBC: 0 % (ref 0.0–0.2)

## 2021-05-01 LAB — COMPREHENSIVE METABOLIC PANEL
ALT: 134 U/L — ABNORMAL HIGH (ref 0–44)
AST: 149 U/L — ABNORMAL HIGH (ref 15–41)
Albumin: 4.1 g/dL (ref 3.5–5.0)
Alkaline Phosphatase: 73 U/L (ref 38–126)
Anion gap: 11 (ref 5–15)
BUN: 9 mg/dL (ref 6–20)
CO2: 25 mmol/L (ref 22–32)
Calcium: 9 mg/dL (ref 8.9–10.3)
Chloride: 102 mmol/L (ref 98–111)
Creatinine, Ser: 0.99 mg/dL (ref 0.61–1.24)
GFR, Estimated: >60 mL/min (ref >60)
Glucose, Bld: 107 mg/dL — ABNORMAL HIGH (ref 70–99)
Potassium: 4.5 mmol/L (ref 3.5–5.1)
Sodium: 138 mmol/L (ref 135–145)
Total Bilirubin: 1.2 mg/dL (ref 0.3–1.2)
Total Protein: 7.5 g/dL (ref 6.5–8.1)

## 2021-05-01 LAB — RPR: RPR Ser Ql: NONREACTIVE

## 2021-05-01 LAB — HIV ANTIBODY (ROUTINE TESTING W REFLEX): HIV Screen 4th Generation wRfx: NONREACTIVE

## 2021-05-01 MED ORDER — FAMOTIDINE IN NACL 20-0.9 MG/50ML-% IV SOLN
20.0000 mg | Freq: Once | INTRAVENOUS | Status: AC
  Administered 2021-05-01: 20 mg via INTRAVENOUS
  Filled 2021-05-01: qty 50

## 2021-05-01 MED ORDER — DOXYCYCLINE HYCLATE 100 MG PO CAPS
100.0000 mg | ORAL_CAPSULE | Freq: Two times a day (BID) | ORAL | 0 refills | Status: DC
Start: 2021-05-01 — End: 2023-03-08

## 2021-05-01 MED ORDER — DIPHENHYDRAMINE HCL 50 MG/ML IJ SOLN
50.0000 mg | Freq: Once | INTRAMUSCULAR | Status: AC
  Administered 2021-05-01: 50 mg via INTRAVENOUS
  Filled 2021-05-01: qty 1

## 2021-05-01 MED ORDER — DIPHENHYDRAMINE HCL 25 MG PO TABS: 25.0000 mg | ORAL_TABLET | Freq: Four times a day (QID) | ORAL | 0 refills | Status: DC

## 2021-05-01 MED ORDER — METHYLPREDNISOLONE SODIUM SUCC 125 MG IJ SOLR
125.0000 mg | Freq: Once | INTRAMUSCULAR | Status: AC
  Administered 2021-05-01: 125 mg via INTRAVENOUS
  Filled 2021-05-01: qty 2

## 2021-05-01 MED ORDER — PREDNISONE 20 MG PO TABS
40.0000 mg | ORAL_TABLET | Freq: Every day | ORAL | 0 refills | Status: AC
Start: 2021-05-01 — End: 2021-05-05

## 2021-05-01 MED ORDER — SODIUM CHLORIDE 0.9 % IV BOLUS
1000.0000 mL | Freq: Once | INTRAVENOUS | Status: AC
  Administered 2021-05-01: 1000 mL via INTRAVENOUS

## 2021-05-01 MED ORDER — ONDANSETRON 4 MG PO TBDP: 4.0000 mg | ORAL_TABLET | Freq: Three times a day (TID) | ORAL | 0 refills | Status: DC | PRN

## 2021-05-01 MED ORDER — EPINEPHRINE 0.3 MG/0.3ML IJ SOAJ: 0.3000 mg | INTRAMUSCULAR | 3 refills | Status: DC | PRN

## 2021-05-01 NOTE — ED Notes (Signed)
Pt to ED c/op pruritic rash over legs\, back, buttocks after tick bite. Rash started last night. Was bitten by 2 ticks 2 weeks ago. Pt had rash 2 weeks ago and took Benadryl and it resolved.  Pt states it is slightly hard to swallow. Was vomiting this morning around 0200.

## 2021-05-01 NOTE — ED Triage Notes (Signed)
Pt with rash all over that started last night at 2200. Pt took 4 benadryl caps at 2300 last night without relief. Pt states that he has not taken anything new or changed any products. pt have tick bites x 2 weeks ago.

## 2021-05-01 NOTE — Discharge Instructions (Signed)
Your symptoms today are either due to [facility] spotted fever from recent tick bite or an allergic reaction.  Take doxycycline as prescribed to treat possible infection of [facility] spotted fever.  In case this is an allergy, take the prednisone daily for the next 4 days and Benadryl as well to prevent recurrence of symptoms.  Avoid exposure to any new or potentially irritating substances.  Avoid milk or red meat until your blood test comes back, which should take 4 or 5 days.

## 2021-05-01 NOTE — ED Provider Notes (Signed)
[facility] Emergency Department Provider Note  ____________________________________________  Time seen: Approximately 8:08 AM  I have reviewed the triage vital signs and the nursing notes.   HISTORY  Chief Complaint Rash    HPI Juan Avila is a 50 y.o. male with a history of hypertension who comes ED complaining of a diffuse rash that started last night.  He took Benadryl without any improvement.  Denies shortness of breath but does have some nonproductive cough, vomited twice this morning, has headaches.  No myalgia or fever or fatigue.  2 weeks ago he was bitten by 2 ticks.  He does not think they are on his body for very long, no circular rash in those areas on the right knee and the left side of his groin.  He does report having a similar rash about 2 weeks ago shortly after being bitten by the ticks which resolved with Benadryl at that time.  Last night at dinner he had beef.   Rash is not painful but is very itchy.   Past Medical History:  Diagnosis Date  . Arthritis    "n shoulders'  . Depression   . Headache   . Hypertension 2014   "Dr. Lennice Sites dx patient, pt is no longer on medicine.  . Seizures (HCC) onset in 2006    first seizure occurred after trip to [place] in 2006 for work where he contracted malaria     Patient Active Problem List   Diagnosis Date Noted  . History of seizure 11/09/2015  . Hypogonadism in male 11/09/2015  . Hypertension 06/17/2015  . Tobacco abuse 06/17/2015     Past Surgical History:  Procedure Laterality Date  . BACK SURGERY     in 1992  . SHOULDER SURGERY Right    first surgery was arounf 12 years ago, second surgery in 2007     Prior to Admission medications   Medication Sig Start Date End Date Taking? Authorizing Provider  diphenhydrAMINE (BENADRYL) 25 MG tablet Take 1 tablet (25 mg total) by mouth every 6 (six) hours for 3 days. 05/01/21 05/04/21 Yes Sharman Cheek, MD  doxycycline (VIBRAMYCIN)  100 MG capsule Take 1 capsule (100 mg total) by mouth 2 (two) times daily. 05/01/21  Yes Sharman Cheek, MD  EPINEPHrine 0.3 mg/0.3 mL IJ SOAJ injection Inject 0.3 mg into the muscle as needed for anaphylaxis. Follow package instructions as needed for severe allergy or anaphylactic reaction. 05/01/21  Yes Sharman Cheek, MD  ondansetron (ZOFRAN ODT) 4 MG disintegrating tablet Take 1 tablet (4 mg total) by mouth every 8 (eight) hours as needed for nausea or vomiting. 05/01/21  Yes Sharman Cheek, MD  predniSONE (DELTASONE) 20 MG tablet Take 2 tablets (40 mg total) by mouth daily with breakfast for 4 days. 05/01/21 05/05/21 Yes Sharman Cheek, MD  amoxicillin-clavulanate (AUGMENTIN) 875-125 MG tablet Take 1 tablet by mouth 2 (two) times daily. Patient not taking: Reported on 05/01/2021 11/18/15   Margaretann Loveless, PA-C  cyclobenzaprine (FLEXERIL) 10 MG tablet Take 1 tablet (10 mg total) by mouth at bedtime. Patient not taking: Reported on 05/01/2021 11/09/15   Margaretann Loveless, PA-C  hydrochlorothiazide (HYDRODIURIL) 25 MG tablet TAKE 1 TABLET (25 MG TOTAL) BY MOUTH DAILY. Patient not taking: Reported on 05/01/2021 11/09/15   Margaretann Loveless, PA-C  ibuprofen (ADVIL,MOTRIN) 800 MG tablet Take 1 tablet (800 mg total) by mouth every 8 (eight) hours as needed. Patient not taking: Reported on 05/01/2021 05/20/16   Evon Slack, PA-C  meloxicam (MOBIC) 15 MG tablet Take 1 tablet (15 mg total) by mouth daily. Patient not taking: Reported on 05/01/2021 11/09/15   Margaretann LovelessBurnette, Jennifer M, PA-C  penicillin v potassium (VEETID) 500 MG tablet Take 1 tablet (500 mg total) by mouth 4 (four) times daily. Patient not taking: Reported on 05/01/2021 05/20/16   Evon SlackGaines, Thomas C, PA-C  traMADol (ULTRAM) 50 MG tablet Take 1 tablet (50 mg total) by mouth every 6 (six) hours as needed. Patient not taking: Reported on 05/01/2021 05/20/16   Evon SlackGaines, Thomas C, PA-C     Allergies Patient has no known  allergies.   Family History  Problem Relation Age of Onset  . Hypertension Mother   . CVA Maternal Aunt 54       cause of death  . Hypertension Maternal Grandmother   . Heart disease Maternal Grandmother   . Heart attack Maternal Grandmother 32       cause of death    Social History Social History   Tobacco Use  . Smoking status: Current Every Day Smoker    Packs/day: 1.50    Years: 27.00    Pack years: 40.50    Types: Cigarettes  Substance Use Topics  . Alcohol use: Yes    Alcohol/week: 3.0 - 4.0 standard drinks    Types: 3 - 4 Standard drinks or equivalent per week  . Drug use: No    Review of Systems  Constitutional:   No fever or chills.  ENT:   No sore throat. No rhinorrhea. Cardiovascular:   No chest pain or syncope. Respiratory:   No dyspnea positive cough. Gastrointestinal:   Negative for abdominal pain, positive vomiting Musculoskeletal:   Negative for focal pain or swelling All other systems reviewed and are negative except as documented above in ROS and HPI.  ____________________________________________   PHYSICAL EXAM:  VITAL SIGNS: ED Triage Vitals  Enc Vitals Group     BP 05/01/21 0712 (S) (!) 156/100     Pulse Rate 05/01/21 0712 (!) 132     Resp 05/01/21 0712 18     Temp 05/01/21 0712 98.4 F (36.9 C)     Temp Source 05/01/21 0712 Oral     SpO2 05/01/21 0712 94 %     Weight 05/01/21 0713 176 lb 5.9 oz (80 kg)     Height 05/01/21 0713 5\' 10"  (1.778 m)     Head Circumference --      Peak Flow --      Pain Score 05/01/21 0713 7     Pain Loc --      Pain Edu? --      Excl. in GC? --     Vital signs reviewed, nursing assessments reviewed.   Constitutional:   Alert and oriented. Non-toxic appearance. Eyes:   Conjunctivae are normal. EOMI. PERRL. ENT      Head:   Normocephalic and atraumatic.      Nose:   Wearing a mask.      Mouth/Throat:   Wearing a mask.      Neck:   No meningismus. Full ROM. Hematological/Lymphatic/Immunilogical:    No cervical lymphadenopathy. Cardiovascular:   RRR. Symmetric bilateral radial and DP pulses.  No murmurs. Cap refill less than 2 seconds. Respiratory:   Normal respiratory effort without tachypnea/retractions. Breath sounds are clear and equal bilaterally. No wheezes/rales/rhonchi.  No inducible cough or wheezing with FEV1 maneuver Gastrointestinal:   Soft and nontender. Non distended. There is no CVA tenderness.  No rebound, rigidity, or  guarding. Genitourinary:   deferred Musculoskeletal:   Normal range of motion in all extremities. No joint effusions.  No lower extremity tenderness.  No edema. Neurologic:   Normal speech and language.  Motor grossly intact. No acute focal neurologic deficits are appreciated.  Skin:    Skin is warm, dry and intact.  There is a diffuse morbilliform rash sparing the palms, confluent over the back.  No petechiae, purpura, or bullae.  ____________________________________________    LABS (pertinent positives/negatives) (all labs ordered are listed, but only abnormal results are displayed) Labs Reviewed  CBC WITH DIFFERENTIAL/PLATELET - Abnormal; Notable for the following components:      Result Value   WBC 14.2 (*)    Hemoglobin 17.8 (*)    MCH 34.8 (*)    MCHC 36.1 (*)    Neutro Abs 11.9 (*)    Monocytes Absolute 1.2 (*)    All other components within normal limits  COMPREHENSIVE METABOLIC PANEL - Abnormal; Notable for the following components:   Glucose, Bld 107 (*)    AST 149 (*)    ALT 134 (*)    All other components within normal limits  [place] MTN SPOTTED FVR ABS PNL(IGG+IGM)  MISC [facility] TEST (SEND OUT)  RPR  HIV ANTIBODY (ROUTINE TESTING W REFLEX)   ____________________________________________   EKG   ____________________________________________    RADIOLOGY  No results found.  ____________________________________________   PROCEDURES Procedures  ____________________________________________  DIFFERENTIAL DIAGNOSIS    Allergic reaction, [place] spotted fever, alpha galactosidase allergy, secondary syphilis  CLINICAL IMPRESSION / ASSESSMENT AND PLAN / ED COURSE  Medications ordered in the ED: Medications  sodium chloride 0.9 % bolus 1,000 mL (1,000 mLs Intravenous New Bag/Given 05/01/21 4098)  methylPREDNISolone sodium succinate (SOLU-MEDROL) 125 mg/2 mL injection 125 mg (125 mg Intravenous Given 05/01/21 1191)  diphenhydrAMINE (BENADRYL) injection 50 mg (50 mg Intravenous Given 05/01/21 0821)  famotidine (PEPCID) IVPB 20 mg premix (0 mg Intravenous Stopped 05/01/21 0914)    Pertinent labs & imaging results that were available during my care of the patient were reviewed by me and considered in my medical decision making (see chart for details).  Juan Avila was evaluated in Emergency Department on 05/01/2021 for the symptoms described in the history of present illness. He was evaluated in the context of the global COVID-19 pandemic, which necessitated consideration that the patient might be at risk for infection with the SARS-CoV-2 virus that causes COVID-19. Institutional protocols and algorithms that pertain to the evaluation of patients at risk for COVID-19 are in a state of rapid change based on information released by regulatory bodies including the CDC and federal and state organizations. These policies and algorithms were followed during the patient's care in the ED.   Patient presents with diffuse rash, history of tick bites, most concerning for [facility] spotted fever versus alpha galactosidase allergy given history.  Broader differential includes other allergic reaction.  No historical features to suggest secondary syphilis or primary HIV, will send screening labs.  IV fluids for tachycardia.  Steroids and Benadryl.  Plan to start on doxycycline.   ----------------------------------------- 11:25 AM on 05/01/2021 -----------------------------------------  Lab work-up reassuring, improved  with IV fluids, symptoms minimal now.  Tolerating oral intake.  Will prescribe medications to treat both [facility] spotted fever and potential allergy.  Syphilis, HIV , RMSF, alpha gal labs sent.  These can be followed up by primary care next week      ____________________________________________   FINAL CLINICAL IMPRESSION(S) /  ED DIAGNOSES    Final diagnoses:  [facility] spotted fever     ED Discharge Orders         Ordered    doxycycline (VIBRAMYCIN) 100 MG capsule  2 times daily        05/01/21 1122    ondansetron (ZOFRAN ODT) 4 MG disintegrating tablet  Every 8 hours PRN        05/01/21 1122    predniSONE (DELTASONE) 20 MG tablet  Daily with breakfast        05/01/21 1122    diphenhydrAMINE (BENADRYL) 25 MG tablet  Every 6 hours        05/01/21 1122    EPINEPHrine 0.3 mg/0.3 mL IJ SOAJ injection  As needed        05/01/21 1122          Portions of this note were generated with dragon dictation software. Dictation errors may occur despite best attempts at proofreading.   Sharman Cheek, MD 05/01/21 1126

## 2021-05-01 NOTE — ED Notes (Signed)
Called lab to confirm correct blood tubes for ordered labs.  Provided water to pt.

## 2021-05-04 LAB — ROCKY MTN SPOTTED FVR ABS PNL(IGG+IGM)
RMSF IgG: NEGATIVE
RMSF IgM: 1.86 index — ABNORMAL HIGH (ref 0.00–0.89)

## 2021-05-07 LAB — MISC LABCORP TEST (SEND OUT): Labcorp test code: 650003

## 2022-08-14 ENCOUNTER — Emergency Department
Admission: EM | Admit: 2022-08-14 | Discharge: 2022-08-14 | Disposition: A | Discharge status: Home / Self Care | Payer: Medicaid Other

## 2023-03-08 ENCOUNTER — Encounter: Payer: Self-pay | Admitting: Internal Medicine

## 2023-03-08 ENCOUNTER — Ambulatory Visit: Payer: Medicaid Other | Admitting: Internal Medicine

## 2023-03-08 VITALS — BP 180/100 | HR 120 | Temp 97.8°F | Resp 16 | Ht 69.5 in | Wt 184.7 lb

## 2023-03-08 DIAGNOSIS — F1090 Alcohol use, unspecified, uncomplicated: Secondary | ICD-10-CM

## 2023-03-08 DIAGNOSIS — Z23 Encounter for immunization: Secondary | ICD-10-CM

## 2023-03-08 DIAGNOSIS — S4991XS Unspecified injury of right shoulder and upper arm, sequela: Secondary | ICD-10-CM | POA: Diagnosis not present

## 2023-03-08 DIAGNOSIS — I10 Essential (primary) hypertension: Secondary | ICD-10-CM

## 2023-03-08 DIAGNOSIS — Z1211 Encounter for screening for malignant neoplasm of colon: Secondary | ICD-10-CM

## 2023-03-08 DIAGNOSIS — Z1159 Encounter for screening for other viral diseases: Secondary | ICD-10-CM | POA: Diagnosis not present

## 2023-03-08 DIAGNOSIS — Z13220 Encounter for screening for lipoid disorders: Secondary | ICD-10-CM | POA: Diagnosis not present

## 2023-03-08 DIAGNOSIS — B1710 Acute hepatitis C without hepatic coma: Secondary | ICD-10-CM

## 2023-03-08 DIAGNOSIS — S4992XS Unspecified injury of left shoulder and upper arm, sequela: Secondary | ICD-10-CM | POA: Diagnosis not present

## 2023-03-08 DIAGNOSIS — Z122 Encounter for screening for malignant neoplasm of respiratory organs: Secondary | ICD-10-CM

## 2023-03-08 DIAGNOSIS — R7989 Other specified abnormal findings of blood chemistry: Secondary | ICD-10-CM

## 2023-03-08 MED ORDER — HYDROCHLOROTHIAZIDE 12.5 MG PO CAPS: 12.5000 mg | ORAL_CAPSULE | Freq: Every day | ORAL | 1 refills | Status: DC

## 2023-03-08 NOTE — Patient Instructions (Addendum)
It was great seeing you today!  Plan discussed at today's visit: -Blood work ordered today, results will be uploaded to [place].  -Start HCTZ 12.5 mg daily for blood pressure. Please obtain a cuff and start checking BP at home -Tdap vaccine given today -Referral to Orthopedics placed -Lung cancer screening ordered  -Cologuard ordered  Follow up in: 1 month   Take care and let us know if you have any questions or concerns prior to your next visit.  Dr. Rosana Berger
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
## 2023-03-09 LAB — COMPLETE METABOLIC PANEL WITH GFR
ALT: 115 U/L — ABNORMAL HIGH (ref 9–46)
Albumin: 4.3 g/dL (ref 3.6–5.1)
Alkaline phosphatase (APISO): 114 U/L (ref 35–144)
BUN: 7 mg/dL (ref 7–25)
CO2: 29 mmol/L (ref 20–32)
Chloride: 102 mmol/L (ref 98–110)
Sodium: 139 mmol/L (ref 135–146)
eGFR: 96 mL/min/{1.73_m2} (ref >=60)

## 2023-03-09 LAB — CBC WITH DIFFERENTIAL/PLATELET
Absolute Monocytes: 920 cells/uL (ref 200–950)
Basophils Absolute: 94 cells/uL (ref 0–200)
Basophils Relative: 1.2 %
MCHC: 35 g/dL (ref 32.0–36.0)
MCV: 99 fL (ref 80.0–100.0)
Monocytes Relative: 11.8 %
Platelets: 248 10*3/uL (ref 140–400)

## 2023-03-09 LAB — LIPID PANEL: Triglycerides: 73 mg/dL (ref <150)

## 2023-03-11 LAB — LIPID PANEL
Cholesterol: 197 mg/dL (ref <200)
Non-HDL Cholesterol (Calc): 119 mg/dL (calc) (ref <130)
Total CHOL/HDL Ratio: 2.5 (calc) (ref <5.0)

## 2023-03-11 LAB — COMPLETE METABOLIC PANEL WITH GFR
AG Ratio: 1.4 (calc) (ref 1.0–2.5)
AST: 141 U/L — ABNORMAL HIGH (ref 10–35)
Creat: 0.96 mg/dL (ref 0.70–1.30)
Glucose, Bld: 115 mg/dL — ABNORMAL HIGH (ref 65–99)
Total Bilirubin: 1.2 mg/dL (ref 0.2–1.2)

## 2023-03-11 LAB — HEPATITIS C ANTIBODY: Hepatitis C Ab: REACTIVE — AB

## 2023-03-11 LAB — CBC WITH DIFFERENTIAL/PLATELET
MCH: 34.7 pg — ABNORMAL HIGH (ref 27.0–33.0)
MPV: 10.3 fL (ref 7.5–12.5)
Neutrophils Relative %: 64.2 %
Total Lymphocyte: 22.4 %

## 2023-03-14 LAB — HCV RNA,QUANTITATIVE REAL TIME PCR
HCV Quantitative Log: 5.85 Log IU/mL — ABNORMAL HIGH
HCV RNA, PCR, QN: 715000 IU/mL — ABNORMAL HIGH

## 2023-03-14 LAB — TEST AUTHORIZATION

## 2023-03-14 LAB — CBC WITH DIFFERENTIAL/PLATELET
Eosinophils Relative: 0.4 %
Hemoglobin: 17.3 g/dL — ABNORMAL HIGH (ref 13.2–17.1)
WBC: 7.8 10*3/uL (ref 3.8–10.8)

## 2023-03-14 LAB — LIPID PANEL: LDL Cholesterol (Calc): 103 mg/dL (calc) — ABNORMAL HIGH

## 2023-03-14 LAB — COMPLETE METABOLIC PANEL WITH GFR: Globulin: 3 g/dL (calc) (ref 1.9–3.7)

## 2023-03-14 NOTE — Addendum Note (Signed)
Addended by: Teodora Medici on: 03/14/2023 09:40 AM   Modules accepted: Orders

## 2023-03-15 LAB — HEPATITIS PANEL, ACUTE
Hep A IgM: NONREACTIVE
Hep B C IgM: NONREACTIVE
Hepatitis B Surface Ag: NONREACTIVE
Hepatitis C Ab: REACTIVE — AB

## 2023-03-15 LAB — CBC WITH DIFFERENTIAL/PLATELET
Eosinophils Absolute: 31 cells/uL (ref 15–500)
HCT: 49.4 % (ref 38.5–50.0)
Lymphs Abs: 1747 cells/uL (ref 850–3900)
Neutro Abs: 5008 cells/uL (ref 1500–7800)
RBC: 4.99 10*6/uL (ref 4.20–5.80)
RDW: 12 % (ref 11.0–15.0)

## 2023-03-15 LAB — TEST AUTHORIZATION

## 2023-03-15 LAB — LIPID PANEL: HDL: 78 mg/dL (ref >=40)

## 2023-03-15 LAB — COMPLETE METABOLIC PANEL WITH GFR
Calcium: 9.9 mg/dL (ref 8.6–10.3)
Potassium: 4.9 mmol/L (ref 3.5–5.3)
Total Protein: 7.3 g/dL (ref 6.1–8.1)

## 2023-03-15 NOTE — Addendum Note (Signed)
Addended by: Margarita Mail on: 03/15/2023 01:00 PM   Modules accepted: Orders

## 2023-03-28 DIAGNOSIS — Z1211 Encounter for screening for malignant neoplasm of colon: Secondary | ICD-10-CM | POA: Diagnosis not present

## 2023-03-29 DIAGNOSIS — M25511 Pain in right shoulder: Secondary | ICD-10-CM | POA: Diagnosis not present

## 2023-03-29 DIAGNOSIS — M25512 Pain in left shoulder: Secondary | ICD-10-CM | POA: Diagnosis not present

## 2023-03-29 DIAGNOSIS — M19011 Primary osteoarthritis, right shoulder: Secondary | ICD-10-CM | POA: Diagnosis not present

## 2023-03-29 DIAGNOSIS — M19012 Primary osteoarthritis, left shoulder: Secondary | ICD-10-CM | POA: Diagnosis not present
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
## 2023-04-05 ENCOUNTER — Encounter: Payer: Self-pay | Admitting: Internal Medicine

## 2023-04-05 ENCOUNTER — Ambulatory Visit: Payer: Medicaid Other | Admitting: Internal Medicine

## 2023-04-05 VITALS — BP 152/100 | HR 99 | Temp 97.7°F | Resp 16 | Ht 69.5 in | Wt 186.0 lb

## 2023-04-05 DIAGNOSIS — B1710 Acute hepatitis C without hepatic coma: Secondary | ICD-10-CM

## 2023-04-05 DIAGNOSIS — I10 Essential (primary) hypertension: Secondary | ICD-10-CM | POA: Diagnosis not present

## 2023-04-05 MED ORDER — LISINOPRIL 20 MG PO TABS: 20.0000 mg | ORAL_TABLET | Freq: Every day | ORAL | 1 refills | Status: DC

## 2023-04-05 MED ORDER — HYDROCHLOROTHIAZIDE 12.5 MG PO CAPS: 12.5000 mg | ORAL_CAPSULE | Freq: Every day | ORAL | 1 refills | Status: DC

## 2023-04-05 NOTE — Patient Instructions (Addendum)
  It was great seeing you today!  Plan discussed at today's visit: -Start new medication Lisinopril 20 mg in the morning for blood pressure in addition to HCTZ 12.5 mg for blood pressure - continue to monitor.  [facility] # [phone] (Lung cancer screening)  Follow up in: 1 month   Take care and let us know if you have any questions or concerns prior to your next visit.  Dr. Caralee Ates

## 2023-04-06 LAB — COLOGUARD: COLOGUARD: POSITIVE — AB

## 2023-05-08 ENCOUNTER — Ambulatory Visit: Payer: Medicaid Other | Admitting: Internal Medicine
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
## 2023-05-10 ENCOUNTER — Ambulatory Visit: Payer: Medicaid Other | Admitting: Internal Medicine

## 2023-05-10 ENCOUNTER — Encounter: Payer: Self-pay | Admitting: Internal Medicine

## 2023-05-10 VITALS — BP 134/86 | HR 100 | Temp 98.0°F | Resp 16 | Ht 69.5 in | Wt 184.0 lb

## 2023-05-10 DIAGNOSIS — Z720 Tobacco use: Secondary | ICD-10-CM

## 2023-05-10 DIAGNOSIS — Z122 Encounter for screening for malignant neoplasm of respiratory organs: Secondary | ICD-10-CM | POA: Diagnosis not present

## 2023-05-10 DIAGNOSIS — F1090 Alcohol use, unspecified, uncomplicated: Secondary | ICD-10-CM | POA: Diagnosis not present

## 2023-05-10 DIAGNOSIS — I10 Essential (primary) hypertension: Secondary | ICD-10-CM

## 2023-05-10 MED ORDER — HYDROCHLOROTHIAZIDE 12.5 MG PO CAPS: 12.5000 mg | ORAL_CAPSULE | Freq: Every day | ORAL | 1 refills | Status: DC

## 2023-05-10 MED ORDER — VARENICLINE TARTRATE (STARTER) 0.5 MG X 11 & 1 MG X 42 PO TBPK: ORAL_TABLET | ORAL | 0 refills | Status: DC

## 2023-05-10 MED ORDER — LISINOPRIL 40 MG PO TABS: 40.0000 mg | ORAL_TABLET | Freq: Every day | ORAL | 1 refills | Status: DC

## 2023-06-12 ENCOUNTER — Encounter: Payer: Self-pay | Admitting: *Deleted
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
## 2023-06-14 ENCOUNTER — Other Ambulatory Visit: Payer: Self-pay | Admitting: *Deleted

## 2023-06-14 ENCOUNTER — Ambulatory Visit: Payer: Medicaid Other | Admitting: Internal Medicine

## 2023-06-14 ENCOUNTER — Encounter: Payer: Self-pay | Admitting: Internal Medicine

## 2023-06-14 VITALS — BP 130/78 | HR 114 | Temp 98.1°F | Resp 16 | Ht 69.5 in | Wt 189.1 lb

## 2023-06-14 DIAGNOSIS — F17210 Nicotine dependence, cigarettes, uncomplicated: Secondary | ICD-10-CM

## 2023-06-14 DIAGNOSIS — M25471 Effusion, right ankle: Secondary | ICD-10-CM

## 2023-06-14 DIAGNOSIS — F1090 Alcohol use, unspecified, uncomplicated: Secondary | ICD-10-CM

## 2023-06-14 DIAGNOSIS — Z122 Encounter for screening for malignant neoplasm of respiratory organs: Secondary | ICD-10-CM

## 2023-06-14 DIAGNOSIS — G4700 Insomnia, unspecified: Secondary | ICD-10-CM

## 2023-06-14 DIAGNOSIS — I10 Essential (primary) hypertension: Secondary | ICD-10-CM | POA: Diagnosis not present

## 2023-06-14 DIAGNOSIS — Z87891 Personal history of nicotine dependence: Secondary | ICD-10-CM

## 2023-06-14 MED ORDER — LISINOPRIL 40 MG PO TABS: 40.0000 mg | ORAL_TABLET | Freq: Every day | ORAL | 1 refills | Status: DC

## 2023-06-14 MED ORDER — TRAZODONE HCL 50 MG PO TABS
25.0000 mg | ORAL_TABLET | Freq: Every evening | ORAL | 0 refills | Status: AC | PRN
Start: 2023-06-14 — End: ?

## 2023-06-14 MED ORDER — HYDROCHLOROTHIAZIDE 12.5 MG PO CAPS: 12.5000 mg | ORAL_CAPSULE | Freq: Every day | ORAL | 1 refills | Status: DC

## 2023-06-19 ENCOUNTER — Telehealth: Payer: Self-pay | Admitting: Gastroenterology

## 2023-06-19 NOTE — Telephone Encounter (Signed)
I called the patient but my coworker already called

## 2023-07-11 ENCOUNTER — Ambulatory Visit: Payer: Medicaid Other | Admitting: Gastroenterology

## 2023-07-12 ENCOUNTER — Ambulatory Visit (INDEPENDENT_AMBULATORY_CARE_PROVIDER_SITE_OTHER): Payer: Medicaid Other | Admitting: Primary Care

## 2023-07-12 DIAGNOSIS — F17200 Nicotine dependence, unspecified, uncomplicated: Secondary | ICD-10-CM

## 2023-07-12 NOTE — Patient Instructions (Signed)

## 2023-07-12 NOTE — Progress Notes (Signed)
Virtual Visit via Telephone Note  I connected with Arinze Rivadeneira Canepa on 07/12/23 at  4:00 PM EDT by telephone and verified that I am speaking with the correct person using two identifiers.  Location: Patient: Home Provider: Office   I discussed the limitations, risks, security and privacy concerns of performing an evaluation and management service by telephone and the availability of in person appointments. I also discussed with the patient that there may be a patient responsible charge related to this service. The patient expressed understanding and agreed to proceed.    Shared Decision Making Visit Lung Cancer Screening Program [phone])   Eligibility: Age 52 y.o. Pack Years Smoking History Calculation 70 (# packs/per year x # years smoked) Recent History of coughing up blood  no Unexplained weight loss? no ( >Than 15 pounds within the last 6 months ) Prior History Lung / other cancer no (Diagnosis within the last 5 years already requiring surveillance chest CT Scans). Smoking Status Current Smoker Former Smokers: Years since quit: NA  Quit Date: NA  Visit Components: Discussion included one or more decision making aids. yes Discussion included risk/benefits of screening. yes Discussion included potential follow up diagnostic testing for abnormal scans. yes Discussion included meaning and risk of over diagnosis. yes Discussion included meaning and risk of False Positives. yes Discussion included meaning of total radiation exposure. yes  Counseling Included: Importance of adherence to annual lung cancer LDCT screening. yes Impact of comorbidities on ability to participate in the program. yes Ability and willingness to under diagnostic treatment. yes  Smoking Cessation Counseling: Current Smokers:  Discussed importance of smoking cessation. yes Information about tobacco cessation classes and interventions provided to patient. yes Patient provided with "ticket" for LDCT Scan.  NA Symptomatic Patient. no  Counseling(Intermediate counseling: > three minutes) 99406 Diagnosis Code: Tobacco Use Z72.0 Asymptomatic Patient yes  Counseling (Intermediate counseling: > three minutes counseling) U0454 Former Smokers:  Discussed the importance of maintaining cigarette abstinence. NA Diagnosis Code: Personal History of Nicotine Dependence. U98.119 Information about tobacco cessation classes and interventions provided to patient. Yes Patient provided with "ticket" for LDCT Scan. NA Written Order for Lung Cancer Screening with LDCT placed in Epic. Yes (CT Chest Lung Cancer Screening Low Dose W/O CM) [id number] Z12.2-Screening of respiratory organs Z87.891-Personal history of nicotine dependence  I have spent 25 minutes of face to face/ virtual visit   time with Mr Carr discussing the risks and benefits of lung cancer screening. We viewed / discussed a power point together that explained in detail the above noted topics. We paused at intervals to allow for questions to be asked and answered to ensure understanding.We discussed that the single most powerful action that he can take to decrease his risk of developing lung cancer is to quit smoking. We discussed whether or not he is ready to commit to setting a quit date. We discussed options for tools to aid in quitting smoking including nicotine replacement therapy, non-nicotine medications, support groups, Quit Smart classes, and behavior modification. We discussed that often times setting smaller, more achievable goals, such as eliminating 1 cigarette a day for a week and then 2 cigarettes a day for a week can be helpful in slowly decreasing the number of cigarettes smoked. This allows for a sense of accomplishment as well as providing a clinical benefit. I provided  him  with smoking cessation  information  with contact information for community resources, classes, free nicotine replacement therapy, and access to mobile apps, text  messaging,  and on-line smoking cessation help. I have also provided  him  the office contact information in the event he needs to contact me, or the screening staff. We discussed the time and location of the scan, and that either Abigail Miyamoto RN, Karlton Lemon, RN  or I will call / send a letter with the results within 24-72 hours of receiving them. The patient verbalized understanding of all of  the above and had no further questions upon leaving the office. They have my contact information in the event they have any further questions.  I spent 3-5 minutes counseling on smoking cessation and the health risks of continued tobacco abuse.  I explained to the patient that there has been a high incidence of coronary artery disease noted on these exams. I explained that this is a non-gated exam therefore degree or severity cannot be determined. This patient is not on statin therapy. I have asked the patient to follow-up with their PCP regarding any incidental finding of coronary artery disease and management with diet or medication as their PCP  feels is clinically indicated. The patient verbalized understanding of the above and had no further questions upon completion of the visit.   Glenford Bayley, NP

## 2023-07-19 ENCOUNTER — Ambulatory Visit
Admission: RE | Admit: 2023-07-19 | Discharge: 2023-07-19 | Disposition: A | Discharge status: Home / Self Care | Payer: Medicaid Other | Source: Ambulatory Visit | Attending: Acute Care | Admitting: Acute Care

## 2023-07-19 DIAGNOSIS — Z87891 Personal history of nicotine dependence: Secondary | ICD-10-CM | POA: Diagnosis not present

## 2023-07-19 DIAGNOSIS — F17210 Nicotine dependence, cigarettes, uncomplicated: Secondary | ICD-10-CM | POA: Diagnosis not present

## 2023-07-19 DIAGNOSIS — Z122 Encounter for screening for malignant neoplasm of respiratory organs: Secondary | ICD-10-CM

## 2023-07-25 ENCOUNTER — Other Ambulatory Visit: Payer: Self-pay

## 2023-07-25 ENCOUNTER — Ambulatory Visit: Payer: Medicaid Other | Admitting: Gastroenterology

## 2023-07-25 ENCOUNTER — Encounter: Payer: Self-pay | Admitting: Gastroenterology

## 2023-07-25 VITALS — BP 116/80 | HR 92 | Temp 98.3°F | Ht 69.5 in | Wt 186.8 lb

## 2023-07-25 DIAGNOSIS — B182 Chronic viral hepatitis C: Secondary | ICD-10-CM

## 2023-07-25 DIAGNOSIS — F17210 Nicotine dependence, cigarettes, uncomplicated: Secondary | ICD-10-CM

## 2023-07-25 DIAGNOSIS — R195 Other fecal abnormalities: Secondary | ICD-10-CM

## 2023-07-25 DIAGNOSIS — Z122 Encounter for screening for malignant neoplasm of respiratory organs: Secondary | ICD-10-CM

## 2023-07-25 DIAGNOSIS — B1920 Unspecified viral hepatitis C without hepatic coma: Secondary | ICD-10-CM

## 2023-07-25 DIAGNOSIS — Z87891 Personal history of nicotine dependence: Secondary | ICD-10-CM

## 2023-07-25 MED ORDER — NA SULFATE-K SULFATE-MG SULF 17.5-3.13-1.6 GM/177ML PO SOLN: 354.0000 mL | Freq: Once | ORAL | 0 refills | Status: AC

## 2023-07-25 NOTE — Progress Notes (Signed)
Wyline Mood MD, MRCP(U.K) [address]  [address]  [place], [place] [address]  Main: [phone]  Fax: [phone]   Gastroenterology Consultation  Referring Provider:     Margarita Mail, DO Primary Care Physician:  Margarita Mail, DO Primary Gastroenterologist:  Dr. Wyline Mood  Reason for Consultation:     Cologuard positive and Hep C         HPI:   Juan Avila is a 52 y.o. y/o male referred for consultation & management  by Dr. Margarita Mail, DO.     03/08/2023: Hep C ab positive , Hep B s Ag negative, HCV viral load 715,000. AST 141, ALT 115. T bil 1.2. Hb 17.3 Recent positive stool test .    Denies any illegal drug use, denies any tattoos no military service does admit that he was incarcerated for a DUI.  Never treated for hepatitis C was not aware of etiology of hepatitis C.  Received vaccinations as a child.  History of excess alcohol consumption in the past but nothing recently.  Never had a colonoscopy no family history of colon cancer or polyps no change in bowel habits no blood in the stool no unintentional weight loss.  He has been treated for hepatitis C   Past Medical History:  Diagnosis Date   Arthritis    "n shoulders'   Depression    Headache    Hypertension 2014   "Dr. Lennice Sites dx patient, pt is no longer on medicine.   Seizures (HCC) onset in 2006    first seizure occurred after trip to [place] in 2006 for work where he contracted malaria    Past Surgical History:  Procedure Laterality Date   BACK SURGERY     in 1992   SHOULDER SURGERY Right    first surgery was arounf 12 years ago, second surgery in 2007   SHOULDER SURGERY Right     Prior to Admission medications   Medication Sig Start Date End Date Taking? Authorizing Provider  hydrochlorothiazide (MICROZIDE) 12.5 MG capsule Take 1 capsule (12.5 mg total) by mouth daily. 06/14/23   Margarita Mail, DO  lisinopril (ZESTRIL) 40 MG tablet Take 1 tablet (40 mg total) by mouth  daily. 06/14/23   Margarita Mail, DO  traZODone (DESYREL) 50 MG tablet Take 0.5-1 tablets (25-50 mg total) by mouth at bedtime as needed for sleep. Patient not taking: Reported on 07/12/2023 06/14/23   Margarita Mail, DO    Family History  Problem Relation Age of Onset   Hypertension Mother    CVA Maternal Aunt 22       cause of death   Hypertension Maternal Grandmother    Heart disease Maternal Grandmother    Heart attack Maternal Grandmother 32       cause of death     Social History   Tobacco Use   Smoking status: Every Day    Current packs/day: 1.00    Average packs/day: 1 pack/day for 27.0 years (27.0 ttl pk-yrs)    Types: Cigarettes  Vaping Use   Vaping status: Never Used  Substance Use Topics   Alcohol use: Yes    Alcohol/week: 3.0 - 4.0 standard drinks of alcohol    Types: 3 - 4 Standard drinks or equivalent per week   Drug use: Yes    Types: Marijuana    Allergies as of 07/25/2023   (No Known Allergies)    Review of Systems:    All systems reviewed and negative except where  noted in HPI.   Physical Exam:  BP 116/80   Pulse 92   Temp 98.3 F (36.8 C) (Oral)   Ht 5' 9.5" (1.765 m)   Wt 186 lb 12.8 oz (84.7 kg)   BMI 27.19 kg/m  No LMP for male patient. Psych:  Alert and cooperative. Normal mood and affect. General:   Alert,  Well-developed, well-nourished, pleasant and cooperative in NAD Head:  Normocephalic and atraumatic. Eyes:  Sclera clear, no icterus.   Conjunctiva pink. Ears:  Normal auditory acuity.  Neurologic:  Alert and oriented x3;  grossly normal neurologically. Psych:  Alert and cooperative. Normal mood and affect.  Imaging Studies: CT CHEST LUNG CA SCREEN LOW DOSE W/O CM  Result Date: 07/24/2023 CLINICAL DATA:  Current smoker with 70 pack-year history EXAM: CT CHEST WITHOUT CONTRAST LOW-DOSE FOR LUNG CANCER SCREENING TECHNIQUE: Multidetector CT imaging of the chest was performed following the standard protocol without IV contrast.  RADIATION DOSE REDUCTION: This exam was performed according to the departmental dose-optimization program which includes automated exposure control, adjustment of the mA and/or kV according to patient size and/or use of iterative reconstruction technique. COMPARISON:  None Available. FINDINGS: Cardiovascular: Normal heart size. No pericardial effusion. Normal caliber thoracic aorta with mild calcified plaque. Mild coronary artery calcifications. Mediastinum/Nodes: Esophagus and thyroid are unremarkable. No enlarged lymph nodes seen in the chest. Lungs/Pleura: Central airways are patent. No consolidation, pleural effusion or pneumothorax. Tiny solid pulmonary nodule of the left upper lobe measuring 2.3 mm on image 109. Upper Abdomen: Gallstones.  No acute abnormality. Musculoskeletal: No chest wall mass or suspicious bone lesions identified. IMPRESSION: 1. Lung-RADS 2S, benign appearance or behavior. Continue annual screening with low-dose chest CT without contrast in 12 months. S modifier for coronary artery calcifications. 2. Mild coronary artery calcifications, recommend ASCVD risk assessment. 3. Mild aortic Atherosclerosis (ICD10-I70.0). Electronically Signed   By: Allegra Lai M.D.   On: 07/24/2023 17:29    Assessment and Plan:   Juan Avila is a 52 y.o. y/o male has been referred for hepatitis C and cologuard positive, treatment nave   Plan  RUQ USG and elastrography to determine if he has liver cirrhosis Hep A ab , Hep B s Ab and will advised on vaccination status next visit Colonoscopy for positive Cologuard  I have discussed alternative options, risks & benefits,  which include, but are not limited to, bleeding, infection, perforation,respiratory complication & drug reaction.  The patient agrees with this plan & written consent will be obtained.     Follow up in 3 months   Dr Wyline Mood MD,MRCP(U.K)

## 2023-07-25 NOTE — Patient Instructions (Signed)
Please do not eat or drink 8 hours prior to your ultrasound. [facility]30 AM tomorrow 07/26/2023. If this does not work for you, please contact central scheduling to [phone] to reschedule.

## 2023-07-26 ENCOUNTER — Ambulatory Visit
Admission: RE | Admit: 2023-07-26 | Discharge: 2023-07-26 | Disposition: A | Discharge status: Home / Self Care | Payer: Medicaid Other | Source: Ambulatory Visit | Attending: Gastroenterology | Admitting: Gastroenterology

## 2023-07-26 DIAGNOSIS — B182 Chronic viral hepatitis C: Secondary | ICD-10-CM | POA: Insufficient documentation

## 2023-07-29 LAB — HCV FIBROSURE
ALPHA 2-MACROGLOBULINS, QN: 161 mg/dL (ref 110–276)
ALT (SGPT) P5P: 52 IU/L (ref 0–55)
Apolipoprotein A-1: 161 mg/dL (ref 101–178)
Bilirubin, Total: 0.5 mg/dL (ref 0.0–1.2)
Fibrosis Score: 0.17 (ref 0.00–0.21)
GGT: 67 IU/L — ABNORMAL HIGH (ref 0–65)
Haptoglobin: 165 mg/dL (ref 23–355)
Necroinflammat Activity Score: 0.26 — ABNORMAL HIGH (ref 0.00–0.17)

## 2023-07-29 LAB — COMPREHENSIVE METABOLIC PANEL
ALT: 44 IU/L (ref 0–44)
AST: 49 IU/L — ABNORMAL HIGH (ref 0–40)
Albumin: 4.3 g/dL (ref 4.1–5.1)
Alkaline Phosphatase: 87 IU/L (ref 44–121)
BUN/Creatinine Ratio: 13 (ref 9–20)
BUN: 15 mg/dL (ref 6–24)
Bilirubin Total: 0.6 mg/dL (ref 0.0–1.2)
CO2: 24 mmol/L (ref 20–29)
Calcium: 9.5 mg/dL (ref 8.7–10.2)
Chloride: 98 mmol/L (ref 96–106)
Creatinine, Ser: 1.19 mg/dL (ref 0.76–1.27)
Globulin, Total: 2.5 g/dL (ref 1.5–4.5)
Glucose: 112 mg/dL — ABNORMAL HIGH (ref 70–99)
Potassium: 4.9 mmol/L (ref 3.5–5.2)
Sodium: 137 mmol/L (ref 134–144)
Total Protein: 6.8 g/dL (ref 6.0–8.5)
eGFR: 74 mL/min/{1.73_m2} (ref >59)

## 2023-07-29 LAB — HEPATITIS A ANTIBODY, TOTAL: hep A Total Ab: POSITIVE — AB

## 2023-07-29 LAB — CBC WITH DIFFERENTIAL/PLATELET
Basophils Absolute: 0.1 10*3/uL (ref 0.0–0.2)
Basos: 1 %
EOS (ABSOLUTE): 0.1 10*3/uL (ref 0.0–0.4)
Eos: 1 %
Hematocrit: 44 % (ref 37.5–51.0)
Hemoglobin: 15 g/dL (ref 13.0–17.7)
Immature Grans (Abs): 0 10*3/uL (ref 0.0–0.1)
Immature Granulocytes: 0 %
Lymphocytes Absolute: 1.8 10*3/uL (ref 0.7–3.1)
Lymphs: 23 %
MCH: 33.3 pg — ABNORMAL HIGH (ref 26.6–33.0)
MCHC: 34.1 g/dL (ref 31.5–35.7)
MCV: 98 fL — ABNORMAL HIGH (ref 79–97)
Monocytes Absolute: 0.8 10*3/uL (ref 0.1–0.9)
Monocytes: 9 %
Neutrophils Absolute: 5.3 10*3/uL (ref 1.4–7.0)
Neutrophils: 66 %
Platelets: 320 10*3/uL (ref 150–450)
RBC: 4.5 x10E6/uL (ref 4.14–5.80)
RDW: 12.7 % (ref 11.6–15.4)
WBC: 8.1 10*3/uL (ref 3.4–10.8)

## 2023-07-29 LAB — HEPATITIS B SURFACE ANTIBODY,QUALITATIVE: Hep B Surface Ab, Qual: NONREACTIVE

## 2023-07-29 LAB — PROTIME-INR
INR: 1 (ref 0.9–1.2)
Prothrombin Time: 10.9 s (ref 9.1–12.0)

## 2023-07-29 LAB — HEPATITIS C GENOTYPE: Hepatitis C Genotype: 3

## 2023-08-05 ENCOUNTER — Telehealth: Payer: Self-pay

## 2023-08-05 NOTE — Progress Notes (Signed)
Needs hep C Rx started .

## 2023-08-05 NOTE — Telephone Encounter (Signed)
Referral to [facility] has been sent to day. Pt aware.

## 2023-08-15 ENCOUNTER — Telehealth: Payer: Self-pay

## 2023-08-15 NOTE — Telephone Encounter (Signed)
[employer] is calling because they are trying to reach patient to schedule the shipment of his medication but they have unable to reach patient. Gave patient two phone number we had in chart they did not have his primary number so they will call him on that number.

## 2023-09-09 ENCOUNTER — Other Ambulatory Visit: Payer: Self-pay | Admitting: Internal Medicine

## 2023-09-09 DIAGNOSIS — G4700 Insomnia, unspecified: Secondary | ICD-10-CM

## 2023-09-10 NOTE — Telephone Encounter (Signed)
Requested Prescriptions  Pending Prescriptions Disp Refills   traZODone (DESYREL) 50 MG tablet [Pharmacy Med Name: traZODone 50 MG TABLET] 90 tablet 1    Sig: TAKE 1/2 TO 1 TABLET BY MOUTH EVERY NIGHT AT BEDTIME AS NEEDED FOR SLEEP     Psychiatry: Antidepressants - Serotonin Modulator Passed - 09/09/2023  8:39 AM      Passed - Valid encounter within last 6 months    Recent Outpatient Visits           2 months ago Hypertension, unspecified type   [facility] Margarita Mail, DO   4 months ago Hypertension, unspecified type   [facility] Margarita Mail, DO   5 months ago Hypertension, unspecified type   [facility] Margarita Mail, DO   6 months ago Acute hepatitis C virus infection without hepatic coma   [facility] Margarita Mail, DO   7 years ago Tooth infection   [facility] [place], [place], [place]       Future Appointments             In 3 days Margarita Mail, DO [facility]   In 1 month Wyline Mood, MD [facility] at [facility]

## 2023-09-12 ENCOUNTER — Encounter: Payer: Self-pay | Admitting: Gastroenterology

## 2023-09-13 ENCOUNTER — Encounter
Admission: RE | Disposition: A | Discharge status: Home / Self Care | Payer: Self-pay | Source: Home / Self Care | Attending: Gastroenterology

## 2023-09-13 ENCOUNTER — Other Ambulatory Visit: Payer: Self-pay

## 2023-09-13 ENCOUNTER — Encounter: Payer: Self-pay | Admitting: Gastroenterology

## 2023-09-13 ENCOUNTER — Ambulatory Visit: Payer: Medicaid Other | Admitting: Registered Nurse

## 2023-09-13 ENCOUNTER — Ambulatory Visit: Payer: Medicaid Other | Admitting: Internal Medicine

## 2023-09-13 ENCOUNTER — Ambulatory Visit
Admission: RE | Admit: 2023-09-13 | Discharge: 2023-09-13 | Disposition: A | Discharge status: Home / Self Care | Payer: Medicaid Other | Attending: Gastroenterology | Admitting: Gastroenterology

## 2023-09-13 DIAGNOSIS — G40909 Epilepsy, unspecified, not intractable, without status epilepticus: Secondary | ICD-10-CM | POA: Insufficient documentation

## 2023-09-13 DIAGNOSIS — R195 Other fecal abnormalities: Secondary | ICD-10-CM | POA: Diagnosis not present

## 2023-09-13 DIAGNOSIS — K635 Polyp of colon: Secondary | ICD-10-CM | POA: Insufficient documentation

## 2023-09-13 DIAGNOSIS — D120 Benign neoplasm of cecum: Secondary | ICD-10-CM | POA: Diagnosis not present

## 2023-09-13 DIAGNOSIS — D123 Benign neoplasm of transverse colon: Secondary | ICD-10-CM | POA: Diagnosis not present

## 2023-09-13 DIAGNOSIS — K621 Rectal polyp: Secondary | ICD-10-CM

## 2023-09-13 DIAGNOSIS — F1290 Cannabis use, unspecified, uncomplicated: Secondary | ICD-10-CM | POA: Insufficient documentation

## 2023-09-13 DIAGNOSIS — F17210 Nicotine dependence, cigarettes, uncomplicated: Secondary | ICD-10-CM | POA: Diagnosis not present

## 2023-09-13 DIAGNOSIS — J449 Chronic obstructive pulmonary disease, unspecified: Secondary | ICD-10-CM | POA: Diagnosis not present

## 2023-09-13 DIAGNOSIS — I10 Essential (primary) hypertension: Secondary | ICD-10-CM | POA: Diagnosis not present

## 2023-09-13 DIAGNOSIS — D126 Benign neoplasm of colon, unspecified: Secondary | ICD-10-CM

## 2023-09-13 DIAGNOSIS — Z1211 Encounter for screening for malignant neoplasm of colon: Secondary | ICD-10-CM | POA: Insufficient documentation

## 2023-09-13 HISTORY — PX: COLONOSCOPY WITH PROPOFOL: SHX5780

## 2023-09-13 HISTORY — PX: POLYPECTOMY: SHX5525

## 2023-09-13 LAB — HM COLONOSCOPY

## 2023-09-13 SURGERY — COLONOSCOPY WITH PROPOFOL
Anesthesia: General

## 2023-09-13 MED ORDER — LIDOCAINE HCL (PF) 2 % IJ SOLN
INTRAMUSCULAR | Status: AC
  Filled 2023-09-13: qty 5

## 2023-09-13 MED ORDER — PROPOFOL 10 MG/ML IV BOLUS
INTRAVENOUS | Status: DC | PRN
  Administered 2023-09-13 (×2): 50 mg via INTRAVENOUS
  Administered 2023-09-13: 100 ug/kg/min via INTRAVENOUS
  Administered 2023-09-13: 50 mg via INTRAVENOUS

## 2023-09-13 MED ORDER — SODIUM CHLORIDE 0.9 % IV SOLN: INTRAVENOUS | Status: DC

## 2023-09-13 MED ORDER — LIDOCAINE HCL (CARDIAC) PF 100 MG/5ML IV SOSY
PREFILLED_SYRINGE | INTRAVENOUS | Status: DC | PRN
  Administered 2023-09-13: 100 mg via INTRAVENOUS

## 2023-09-13 MED ORDER — DEXMEDETOMIDINE HCL IN NACL 80 MCG/20ML IV SOLN
INTRAVENOUS | Status: DC | PRN
  Administered 2023-09-13: 8 ug via INTRAVENOUS
  Administered 2023-09-13: 4 ug via INTRAVENOUS
  Administered 2023-09-13: 8 ug via INTRAVENOUS

## 2023-09-13 MED ORDER — PROPOFOL 10 MG/ML IV BOLUS
INTRAVENOUS | Status: AC
  Filled 2023-09-13: qty 20

## 2023-09-13 NOTE — Op Note (Signed)
[facility] Patient Name: Juan Avila Procedure Date: 09/13/2023 9:20 AM MRN: [id number] Account #: [id number] Date of Birth: 11/04/1971 Admit Type: Outpatient Age: 52 Room: [facility] ENDO ROOM 4 Gender: Male Note Status: Finalized Instrument Name: Nelda Marseille [id number] Procedure:             Colonoscopy Indications:           Screening for colorectal malignant neoplasm due to                         positive Cologuard test Providers:             Wyline Mood MD, MD Referring MD:          Margarita Mail (Referring MD) Medicines:             Monitored Anesthesia Care Complications:         No immediate complications. Procedure:             Pre-Anesthesia Assessment:                        - Prior to the procedure, a History and Physical was                         performed, and patient medications, allergies and                         sensitivities were reviewed. The patient's tolerance                         of previous anesthesia was reviewed.                        - The risks and benefits of the procedure and the                         sedation options and risks were discussed with the                         patient. All questions were answered and informed                         consent was obtained.                        - ASA Grade Assessment: II - A patient with mild                         systemic disease.                        After obtaining informed consent, the colonoscope was                         passed under direct vision. Throughout the procedure,                         the patient's blood pressure, pulse, and oxygen                         saturations were monitored continuously.  The                         Colonoscope was introduced through the anus and                         advanced to the the cecum, identified by the                         appendiceal orifice. The colonoscopy was performed                          with ease. The patient tolerated the procedure well.                         The quality of the bowel preparation was excellent.                         The ileocecal valve, appendiceal orifice, and rectum                         were photographed. Findings:      The perianal and digital rectal examinations were normal.      Seven sessile polyps were found in the rectum. The polyps were 5 to 7 mm       in size. These polyps were removed with a cold snare. Resection and       retrieval were complete.      Two sessile polyps were found in the descending colon. The polyps were 4       to 5 mm in size. These polyps were removed with a cold snare. Resection       and retrieval were complete.      Two sessile polyps were found in the transverse colon. The polyps were 5       to 6 mm in size. These polyps were removed with a cold snare. Resection       and retrieval were complete.      A 5 mm polyp was found in the cecum. The polyp was sessile. The polyp       was removed with a cold snare. Resection and retrieval were complete.      The exam was otherwise without abnormality on direct and retroflexion       views. Impression:            - Seven 5 to 7 mm polyps in the rectum, removed with a                         cold snare. Resected and retrieved.                        - Two 4 to 5 mm polyps in the descending colon,                         removed with a cold snare. Resected and retrieved.                        - Two 5 to 6 mm polyps in the transverse colon,  removed with a cold snare. Resected and retrieved.                        - One 5 mm polyp in the cecum, removed with a cold                         snare. Resected and retrieved.                        - The examination was otherwise normal on direct and                         retroflexion views. Recommendation:        - Discharge patient to home (with escort).                        - Resume previous diet.                         - Continue present medications.                        - Await pathology results.                        - Repeat colonoscopy in 1 year for surveillance. Procedure Code(s):     --- Professional ---                        [phone], Colonoscopy, flexible; with removal of                         tumor(s), polyp(s), or other lesion(s) by snare                         technique Diagnosis Code(s):     --- Professional ---                        Z12.11, Encounter for screening for malignant neoplasm                         of colon                        R19.5, Other fecal abnormalities                        D12.8, Benign neoplasm of rectum                        D12.4, Benign neoplasm of descending colon                        D12.0, Benign neoplasm of cecum                        D12.3, Benign neoplasm of transverse colon (hepatic                         flexure or splenic flexure) CPT copyright 2022 American Medical Association. All rights reserved. The codes documented in this  report are preliminary and upon coder review may  be revised to meet current compliance requirements. Wyline Mood, MD Wyline Mood MD, MD 09/13/2023 9:59:15 AM This report has been signed electronically. Number of Addenda: 0 Note Initiated On: 09/13/2023 9:20 AM Scope Withdrawal Time: 0 hours 17 minutes 16 seconds  Total Procedure Duration: 0 hours 18 minutes 41 seconds  Estimated Blood Loss:  Estimated blood loss: none.      [facility]

## 2023-09-13 NOTE — Anesthesia Procedure Notes (Signed)
Procedure Name: General with mask airway Date/Time: 09/13/2023 9:31 AM  Performed by: Lily Lovings, CRNAPre-anesthesia Checklist: Patient identified, Emergency Drugs available, Suction available, Patient being monitored and Timeout performed Patient Re-evaluated:Patient Re-evaluated prior to induction Oxygen Delivery Method: Simple face mask Preoxygenation: Pre-oxygenation with 100% oxygen Induction Type: IV induction

## 2023-09-13 NOTE — Anesthesia Preprocedure Evaluation (Signed)
Anesthesia Evaluation  Patient identified by MRN, date of birth, ID band Patient awake    Reviewed: Allergy & Precautions, NPO status , Patient's Chart, lab work & pertinent test results  Airway Mallampati: II  TM Distance: >3 FB Neck ROM: Full    Dental  (+) Poor Dentition, Missing, Chipped, Dental Advisory Given   Pulmonary neg pulmonary ROS, COPD, Current SmokerPatient did not abstain from smoking.   Pulmonary exam normal  + decreased breath sounds      Cardiovascular Exercise Tolerance: Good hypertension, Pt. on medications negative cardio ROS Normal cardiovascular exam Rhythm:Regular Rate:Normal     Neuro/Psych Seizures -, Well Controlled,    Depression    negative neurological ROS  negative psych ROS   GI/Hepatic negative GI ROS, Neg liver ROS,,,  Endo/Other  negative endocrine ROS    Renal/GU negative Renal ROS  negative genitourinary   Musculoskeletal   Abdominal Normal abdominal exam  (+)   Peds negative pediatric ROS (+)  Hematology negative hematology ROS (+)   Anesthesia Other Findings Past Medical History: No date: Arthritis     Comment:  "n shoulders' No date: Depression No date: Headache 2014: Hypertension     Comment:  "Dr. Lennice Sites dx patient, pt is no longer on medicine. onset in 2006 : Seizures (HCC)     Comment:  first seizure occurred after trip to [place] in 2006 for               work where he contracted malaria  Past Surgical History: No date: BACK SURGERY     Comment:  in 1992 No date: SHOULDER SURGERY; Right     Comment:  first surgery was arounf 12 years ago, second surgery in              2007 No date: SHOULDER SURGERY; Right  BMI    Body Mass Index: 26.49 kg/m      Reproductive/Obstetrics negative OB ROS                             Anesthesia Physical Anesthesia Plan  ASA: 3  Anesthesia Plan: General   Post-op Pain Management:    Induction:  Intravenous  PONV Risk Score and Plan: Propofol infusion and TIVA  Airway Management Planned: Natural Airway  Additional Equipment:   Intra-op Plan:   Post-operative Plan:   Informed Consent: I have reviewed the patients History and Physical, chart, labs and discussed the procedure including the risks, benefits and alternatives for the proposed anesthesia with the patient or authorized representative who has indicated his/her understanding and acceptance.     Dental Advisory Given  Plan Discussed with: Anesthesiologist, CRNA and Surgeon  Anesthesia Plan Comments:        Anesthesia Quick Evaluation

## 2023-09-13 NOTE — Transfer of Care (Signed)
Immediate Anesthesia Transfer of Care Note  Patient: Juan Avila  Procedure(s) Performed: COLONOSCOPY WITH PROPOFOL POLYPECTOMY  Patient Location: PACU and Endoscopy Unit  Anesthesia Type:General  Level of Consciousness: drowsy  Airway & Oxygen Therapy: Patient Spontanous Breathing  Post-op Assessment: Report given to RN and Post -op Vital signs reviewed and stable  Post vital signs: Reviewed and stable  Last Vitals:  Vitals Value Taken Time  BP    Temp    Pulse    Resp    SpO2      Last Pain:  Vitals:   09/13/23 0850  TempSrc: Temporal  PainSc: 0-No pain         Complications: No notable events documented.

## 2023-09-13 NOTE — Anesthesia Postprocedure Evaluation (Signed)
Anesthesia Post Note  Patient: Juan Avila  Procedure(s) Performed: COLONOSCOPY WITH PROPOFOL POLYPECTOMY  Patient location during evaluation: PACU Anesthesia Type: General Level of consciousness: awake Pain management: pain level controlled Vital Signs Assessment: post-procedure vital signs reviewed and stable Respiratory status: spontaneous breathing Cardiovascular status: stable Anesthetic complications: no   No notable events documented.   Last Vitals:  Vitals:   09/13/23 1000 09/13/23 1010  BP: 119/75 116/86  Pulse: 85 75  Resp: 12 20  Temp:    SpO2: 99% 100%    Last Pain:  Vitals:   09/13/23 1010  TempSrc:   PainSc: 0-No pain                 VAN STAVEREN,Krishon Adkison

## 2023-09-13 NOTE — H&P (Signed)
Wyline Mood, MD [address], [place], [place], [address] [address], [address], [place], [place], [address] Phone: [phone]  Fax: [phone]  Primary Care Physician:  Margarita Mail, DO   Pre-Procedure History & Physical: HPI:  Juan Avila is a 52 y.o. male is here for an colonoscopy.   Past Medical History:  Diagnosis Date   Arthritis    "n shoulders'   Depression    Headache    Hypertension 2014   "Dr. Lennice Sites dx patient, pt is no longer on medicine.   Seizures (HCC) onset in 2006    first seizure occurred after trip to [place] in 2006 for work where he contracted malaria    Past Surgical History:  Procedure Laterality Date   BACK SURGERY     in 1992   SHOULDER SURGERY Right    first surgery was arounf 12 years ago, second surgery in 2007   SHOULDER SURGERY Right     Prior to Admission medications   Medication Sig Start Date End Date Taking? Authorizing Provider  hydrochlorothiazide (MICROZIDE) 12.5 MG capsule Take 1 capsule (12.5 mg total) by mouth daily. 06/14/23  Yes Margarita Mail, DO  lisinopril (ZESTRIL) 40 MG tablet Take 1 tablet (40 mg total) by mouth daily. 06/14/23  Yes Margarita Mail, DO  traZODone (DESYREL) 50 MG tablet TAKE 1/2 TO 1 TABLET BY MOUTH EVERY NIGHT AT BEDTIME AS NEEDED FOR SLEEP 09/10/23  Yes Margarita Mail, DO    Allergies as of 07/26/2023   (No Known Allergies)    Family History  Problem Relation Age of Onset   Hypertension Mother    CVA Maternal Aunt 23       cause of death   Hypertension Maternal Grandmother    Heart disease Maternal Grandmother    Heart attack Maternal Grandmother 32       cause of death    Social History   Socioeconomic History   Marital status: Single    Spouse name: Not on file   Number of children: Not on file   Years of education: Not on file   Highest education level: Associate degree: occupational, Scientist, product/process development, or vocational program  Occupational History   Not on  file  Tobacco Use   Smoking status: Every Day    Current packs/day: 1.00    Average packs/day: 1 pack/day for 27.0 years (27.0 ttl pk-yrs)    Types: Cigarettes   Smokeless tobacco: Not on file  Vaping Use   Vaping status: Never Used  Substance and Sexual Activity   Alcohol use: Not Currently    Alcohol/week: 3.0 - 4.0 standard drinks of alcohol    Types: 3 - 4 Standard drinks or equivalent per week   Drug use: Yes    Types: Marijuana   Sexual activity: Yes  Other Topics Concern   Not on file  Social History Narrative   Not on file   Social Determinants of Health   Financial Resource Strain: Low Risk  (04/01/2023)   Overall Financial Resource Strain (CARDIA)    Difficulty of Paying Living Expenses: Not hard at all  Food Insecurity: No Food Insecurity (04/01/2023)   Hunger Vital Sign    Worried About Running Out of Food in the Last Year: Never true    Ran Out of Food in the Last Year: Never true  Transportation Needs: No Transportation Needs (04/01/2023)   PRAPARE - Transportation    Lack of Transportation (Medical): No    Lack  of Transportation (Non-Medical): No  Physical Activity: Sufficiently Active (04/01/2023)   Exercise Vital Sign    Days of Exercise per Week: 7 days    Minutes of Exercise per Session: 150+ min  Stress: No Stress Concern Present (04/01/2023)   [employer] of Occupational Health - Occupational Stress Questionnaire    Feeling of Stress : Not at all  Social Connections: Unknown (04/01/2023)   Social Connection and Isolation Panel [NHANES]    Frequency of Communication with Friends and Family: More than three times a week    Frequency of Social Gatherings with Friends and Family: Three times a week    Attends Religious Services: Never    Active Member of Clubs or Organizations: No    Attends Engineer, structural: Not on file    Marital Status: Not on file  Intimate Partner Violence: Not on file    Review of Systems: See HPI, otherwise  negative ROS  Physical Exam: BP (!) 159/92   Pulse 78   Temp 97.6 F (36.4 C) (Temporal)   Resp 16   Ht 5' 9.5" (1.765 m)   Wt 82.6 kg   SpO2 97%   BMI 26.49 kg/m  General:   Alert,  pleasant and cooperative in NAD Head:  Normocephalic and atraumatic. Neck:  Supple; no masses or thyromegaly. Lungs:  Clear throughout to auscultation, normal respiratory effort.    Heart:  +S1, +S2, Regular rate and rhythm, No edema. Abdomen:  Soft, nontender and nondistended. Normal bowel sounds, without guarding, and without rebound.   Neurologic:  Alert and  oriented x4;  grossly normal neurologically.  Impression/Plan: Juan Avila is here for an colonoscopy to be performed for positive cologuard Risks, benefits, limitations, and alternatives regarding  colonoscopy have been reviewed with the patient.  Questions have been answered.  All parties agreeable.   Wyline Mood, MD  09/13/2023, 8:54 AM

## 2023-09-16 ENCOUNTER — Encounter: Payer: Self-pay | Admitting: Gastroenterology

## 2023-09-16 LAB — SURGICAL PATHOLOGY

## 2023-09-17 ENCOUNTER — Encounter: Payer: Self-pay | Admitting: Gastroenterology

## 2023-09-26 NOTE — Progress Notes (Signed)
Established Patient Office Visit  Subjective    Patient ID: Juan Avila, male    DOB: Apr 15, 1971  Age: 52 y.o. MRN: [id number]  CC:  Chief Complaint  Patient presents with   Follow-up    HPI Juan Avila presents to follow up on chronic medical conditions. Thinking he is having prostate issues today. Noticed urinary hesitancy, incomplete bladder emptying and urinary urgency. Denies nocturia, dysuria or hematuria.   Hypertension: -Medications: HCTZ 12.5 mg, Lisinopril increased to 40 mg at LOV -Checking BP at home (average): averaging 120-130/80-90 -Denies any SOB, CP, vision changes, LE edema or symptoms of hypotension.   History of Seizures: -Due to malaria infection in 2006 -Not on any medication currently, no seizure activity since 2006  Hepatitis C/Alcohol Use Disorder: -Had been drinking 8 beers a day, cut down from 30 daily but today he states he hasn't had anything to drink since May 2024 -LFT's elevated on labs in March, hepatitis C viral load elevated, seeing GI in August -Following with GI, just had labs. Hepatitis A ab positive but everything else negative  Insomnia: -Started on Trazodone 25-50 mg at LOV, doing well. Taking it only as needed.  Health Maintenance: -Blood work UTD -Colon cancer screening: colonoscopy 10/24, repeat in 1 year -Lung cancer screening: 8/24 LUNG-RADS 2 - interested in quitting smoking but does not want Chantix but is interested in something else  Outpatient Encounter Medications as of 09/27/2023  Medication Sig   hydrochlorothiazide (MICROZIDE) 12.5 MG capsule Take 1 capsule (12.5 mg total) by mouth daily.   lisinopril (ZESTRIL) 40 MG tablet Take 1 tablet (40 mg total) by mouth daily.   traZODone (DESYREL) 50 MG tablet TAKE 1/2 TO 1 TABLET BY MOUTH EVERY NIGHT AT BEDTIME AS NEEDED FOR SLEEP   No facility-administered encounter medications on file as of 09/27/2023.    Past Medical History:  Diagnosis Date   Arthritis     "n shoulders'   Depression    Headache    Hypertension 2014   "Dr. Lennice Sites dx patient, pt is no longer on medicine.   Seizures (HCC) onset in 2006    first seizure occurred after trip to [place] in 2006 for work where he contracted malaria    Past Surgical History:  Procedure Laterality Date   BACK SURGERY     in 1992   COLONOSCOPY WITH PROPOFOL N/A 09/13/2023   Procedure: COLONOSCOPY WITH PROPOFOL;  Surgeon: Wyline Mood, MD;  [facility] ENDOSCOPY;  Service: Gastroenterology;  Laterality: N/A;   POLYPECTOMY  09/13/2023   Procedure: POLYPECTOMY;  Surgeon: Wyline Mood, MD;  Location[facility] ENDOSCOPY;  Service: Gastroenterology;;   SHOULDER SURGERY Right    first surgery was arounf 12 years ago, second surgery in 2007   SHOULDER SURGERY Right     Family History  Problem Relation Age of Onset   Hypertension Mother    CVA Maternal Aunt 29       cause of death   Hypertension Maternal Grandmother    Heart disease Maternal Grandmother    Heart attack Maternal Grandmother 32       cause of death    Social History   Socioeconomic History   Marital status: Single    Spouse name: Not on file   Number of children: Not on file   Years of education: Not on file   Highest education level: Associate degree: occupational, Scientist, product/process development, or vocational program  Occupational History   Not on file  Tobacco Use  Smoking status: Every Day    Current packs/day: 1.00    Average packs/day: 1 pack/day for 27.0 years (27.0 ttl pk-yrs)    Types: Cigarettes   Smokeless tobacco: Not on file  Vaping Use   Vaping status: Never Used  Substance and Sexual Activity   Alcohol use: Not Currently    Alcohol/week: 3.0 - 4.0 standard drinks of alcohol    Types: 3 - 4 Standard drinks or equivalent per week   Drug use: Yes    Types: Marijuana   Sexual activity: Yes  Other Topics Concern   Not on file  Social History Narrative   Not on file   Social Determinants of Health   Financial Resource Strain:  Low Risk  (04/01/2023)   Overall Financial Resource Strain (CARDIA)    Difficulty of Paying Living Expenses: Not hard at all  Food Insecurity: No Food Insecurity (04/01/2023)   Hunger Vital Sign    Worried About Running Out of Food in the Last Year: Never true    Ran Out of Food in the Last Year: Never true  Transportation Needs: No Transportation Needs (04/01/2023)   PRAPARE - Administrator, Civil Service (Medical): No    Lack of Transportation (Non-Medical): No  Physical Activity: Sufficiently Active (04/01/2023)   Exercise Vital Sign    Days of Exercise per Week: 7 days    Minutes of Exercise per Session: 150+ min  Stress: No Stress Concern Present (04/01/2023)   [employer] of Occupational Health - Occupational Stress Questionnaire    Feeling of Stress : Not at all  Social Connections: Unknown (04/01/2023)   Social Connection and Isolation Panel [NHANES]    Frequency of Communication with Friends and Family: More than three times a week    Frequency of Social Gatherings with Friends and Family: Three times a week    Attends Religious Services: Never    Active Member of Clubs or Organizations: No    Attends Engineer, structural: Not on file    Marital Status: Not on file  Intimate Partner Violence: Not on file    Review of Systems  Constitutional:  Negative for chills and fever.  Eyes:  Negative for blurred vision.  Respiratory:  Negative for shortness of breath.   Cardiovascular:  Negative for chest pain.  Neurological:  Negative for headaches.  Psychiatric/Behavioral:  The patient has insomnia.         Objective    BP 130/80   Pulse 95   Temp (!) 97.3 F (36.3 C)   Resp 18   Ht 5' 9.5" (1.765 m)   Wt 189 lb 6.4 oz (85.9 kg)   SpO2 96%   BMI 27.57 kg/m   Physical Exam Constitutional:      Appearance: Normal appearance.  HENT:     Head: Normocephalic and atraumatic.  Eyes:     Conjunctiva/sclera: Conjunctivae normal.   Cardiovascular:     Rate and Rhythm: Normal rate and regular rhythm.  Pulmonary:     Effort: Pulmonary effort is normal.     Breath sounds: Normal breath sounds.  Skin:    General: Skin is warm and dry.  Neurological:     General: No focal deficit present.     Mental Status: He is alert. Mental status is at baseline.  Psychiatric:        Mood and Affect: Mood normal.        Behavior: Behavior normal.       Assessment &  Plan:   1. Hypertension, unspecified type: Blood pressure stable here today, no changes made to medications.  2. Insomnia, unspecified type: Stable, doing well with Trazodone PRN.  3. Benign prostatic hyperplasia with incomplete bladder emptying: Will check PSA today, if normal will prescribe Flomax.   - PSA  4. Encounter for tobacco use cessation counseling: Discussed at length, will prescribe Wellbutrin and follow up in 6 weeks.   - buPROPion (WELLBUTRIN XL) 150 MG 24 hr tablet; Take 1 tablet (150 mg total) by mouth daily.  Dispense: 30 tablet; Refill: 1   Return in about 6 weeks (around 11/08/2023).   Margarita Mail, DO

## 2023-09-27 ENCOUNTER — Encounter: Payer: Self-pay | Admitting: Internal Medicine

## 2023-09-27 ENCOUNTER — Ambulatory Visit: Payer: Medicaid Other | Admitting: Internal Medicine

## 2023-09-27 VITALS — BP 130/80 | HR 95 | Temp 97.3°F | Resp 18 | Ht 69.5 in | Wt 189.4 lb

## 2023-09-27 DIAGNOSIS — Z716 Tobacco abuse counseling: Secondary | ICD-10-CM | POA: Diagnosis not present

## 2023-09-27 DIAGNOSIS — N401 Enlarged prostate with lower urinary tract symptoms: Secondary | ICD-10-CM

## 2023-09-27 DIAGNOSIS — R3914 Feeling of incomplete bladder emptying: Secondary | ICD-10-CM

## 2023-09-27 DIAGNOSIS — G4700 Insomnia, unspecified: Secondary | ICD-10-CM

## 2023-09-27 DIAGNOSIS — I10 Essential (primary) hypertension: Secondary | ICD-10-CM | POA: Diagnosis not present

## 2023-09-27 MED ORDER — BUPROPION HCL ER (XL) 150 MG PO TB24
150.0000 mg | ORAL_TABLET | Freq: Every day | ORAL | 1 refills | Status: DC
Start: 2023-09-27 — End: 2024-03-20

## 2023-09-28 LAB — PSA: PSA: 1.77 ng/mL (ref <=4.00)

## 2023-09-29 MED ORDER — TAMSULOSIN HCL 0.4 MG PO CAPS
0.4000 mg | ORAL_CAPSULE | Freq: Every day | ORAL | 3 refills | Status: DC
Start: 2023-09-29 — End: 2024-01-27

## 2023-09-29 NOTE — Addendum Note (Signed)
Addended by: Margarita Mail on: 09/29/2023 06:32 PM   Modules accepted: Orders

## 2023-10-31 ENCOUNTER — Ambulatory Visit: Payer: Medicaid Other | Admitting: Gastroenterology

## 2023-10-31 ENCOUNTER — Encounter: Payer: Self-pay | Admitting: Gastroenterology

## 2023-10-31 VITALS — BP 171/99 | HR 93 | Temp 98.1°F | Wt 188.6 lb

## 2023-10-31 DIAGNOSIS — Z23 Encounter for immunization: Secondary | ICD-10-CM

## 2023-10-31 DIAGNOSIS — B182 Chronic viral hepatitis C: Secondary | ICD-10-CM

## 2023-10-31 NOTE — Progress Notes (Signed)
Juan Mood MD, MRCP(U.K) [address]  [address]  [place], [place] [address]  Main: [phone]  Fax: [phone]   Primary Care Physician: Margarita Mail, DO  Primary Gastroenterologist:  Dr. Wyline Avila   Chief Complaint  Patient presents with   Chronic Hepatitis C    HPI: Juan Avila is a 52 y.o. male  Initially referred and seen on 07/25/2023 for hepatitis C and cologuard positive.   03/08/2023: Hep C ab positive , Hep B s Ag negative, HCV viral load 715,000. AST 141, ALT 115. T bil 1.2. Hb 17.3.Recent positive stool test .      Denies any illegal drug use, denies any tattoos no military service does admit that he was incarcerated for a DUI.  Never treated for hepatitis C was not aware of etiology of hepatitis C.  Received vaccinations as a child.  History of excess alcohol consumption in the past but nothing recently.  Never had a colonoscopy no family history of colon cancer or polyps no change in bowel habits no blood in the stool no unintentional weight loss.  He has been treated for hepatitis C     Interval history 07/25/2023-10/31/2023   07/25/2023: HCV FibroSure no fibrosis, hepatitis B surface antibody negative hepatitis A total antibody positive genotype 3 hemoglobin 15 g INR 1.  09/13/2023 colonoscopy: 12 polyps ranging from 5-7 mm resected.  3 of them were sessile serrated polyps the rest were hyperplastic.  Right upper quadrant ultrasound 08/02/2023 no evidence of significant fibrosis  Doing well ready to start hep C treatment . No new concerns.   Current Outpatient Medications  Medication Sig Dispense Refill   buPROPion (WELLBUTRIN XL) 150 MG 24 hr tablet Take 1 tablet (150 mg total) by mouth daily. 30 tablet 1   hydrochlorothiazide (MICROZIDE) 12.5 MG capsule Take 1 capsule (12.5 mg total) by mouth daily. 90 capsule 1   lisinopril (ZESTRIL) 40 MG tablet Take 1 tablet (40 mg total) by mouth daily. 90 tablet 1   tamsulosin (FLOMAX) 0.4 MG CAPS  capsule Take 1 capsule (0.4 mg total) by mouth daily. 30 capsule 3   traZODone (DESYREL) 50 MG tablet TAKE 1/2 TO 1 TABLET BY MOUTH EVERY NIGHT AT BEDTIME AS NEEDED FOR SLEEP 90 tablet 1   No current facility-administered medications for this visit.    Allergies as of 10/31/2023   (No Known Allergies)      ROS:  General: Negative for anorexia, weight loss, fever, chills, fatigue, weakness. ENT: Negative for hoarseness, difficulty swallowing , nasal congestion. CV: Negative for chest pain, angina, palpitations, dyspnea on exertion, peripheral edema.  Respiratory: Negative for dyspnea at rest, dyspnea on exertion, cough, sputum, wheezing.  GI: See history of present illness. GU:  Negative for dysuria, hematuria, urinary incontinence, urinary frequency, nocturnal urination.  Endo: Negative for unusual weight change.    Physical Examination:   BP (!) 171/99   Pulse 93   Temp 98.1 F (36.7 C) (Oral)   Wt 188 lb 9.6 oz (85.5 kg)   BMI 27.45 kg/m   General: Well-nourished, well-developed in no acute distress.  Eyes: No icterus. Conjunctivae pink. Neuro: Alert and oriented x 3.  Grossly intact. Skin: Warm and dry, no jaundice.   Psych: Alert and cooperative, normal Avila and affect.   Imaging Studies: No results found.  Assessment and Plan:   Juan Avila is a 52 y.o. y/o male here to follow up for hepatitis C  treatment nave, no liver fibrosis  immune to hepatitis A not immune to hepatitis B     Plan  Vaccine for hepatitis B  Commence on Epclusa for hepC    Dr Juan Mood  MD,MRCP [facility]) Follow up in 12 weeks after completing treatment to check for cure with [name].

## 2023-10-31 NOTE — Patient Instructions (Addendum)
Please come back to our lab 12 weeks after you are done with your treatment. Lab Hours are the following: Monday and Thursday 1:30 PM-4:30 PM Tuesday and Wednesday 8:30 AM-4:30 PM  Please remember to come back in a month as a nurse visit for your second Hepatitis B vaccine and then 6 months from today for your last vaccine.

## 2023-11-05 ENCOUNTER — Telehealth: Payer: Self-pay

## 2023-11-05 NOTE — Telephone Encounter (Signed)
Faxed to [facility] for the new start for mavyret hep c medication. Faxed labs, last office visit note, Demographics, insurance card, and Medication and allergie list. Got confirmation fax went through.

## 2023-11-10 NOTE — Progress Notes (Unsigned)
Established Patient Office Visit  Subjective    Patient ID: Juan Avila, male    DOB: 12/30/1970  Age: 52 y.o. MRN: [id number]  CC:  No chief complaint on file.   HPI Juan Avila presents to follow up on chronic medical conditions.   Hypertension: -Medications: HCTZ 12.5 mg, Lisinopril  40 mg -Checking BP at home (average): averaging 120-130/80-90 -Denies any SOB, CP, vision changes, LE edema or symptoms of hypotension.   History of Seizures: -Due to malaria infection in 2006 -Not on any medication currently, no seizure activity since 2006  Hepatitis C/Alcohol Use Disorder: -Had been drinking 8 beers a day, cut down from 30 daily but today he states he hasn't had anything to drink since May 2024 -LFT's elevated on labs in March, hepatitis C viral load elevated, seeing GI in August -Following with GI, just had labs. Hepatitis A ab positive but everything else negative  Insomnia: -Started on Trazodone 25-50 mg at LOV, doing well. Taking it only as needed.  BPH: -Started on Flomax at LOV -PSA 10/24 1.77  Smoking Cessation: -Prescribed Wellbutrin 150 mg Xl at LOV  Health Maintenance: -Blood work UTD -Colon cancer screening: colonoscopy 10/24, repeat in 1 year -Lung cancer screening: 8/24 LUNG-RADS 2 - interested in quitting smoking but does not want Chantix but is interested in something else  Outpatient Encounter Medications as of 11/11/2023  Medication Sig   buPROPion (WELLBUTRIN XL) 150 MG 24 hr tablet Take 1 tablet (150 mg total) by mouth daily.   hydrochlorothiazide (MICROZIDE) 12.5 MG capsule Take 1 capsule (12.5 mg total) by mouth daily.   lisinopril (ZESTRIL) 40 MG tablet Take 1 tablet (40 mg total) by mouth daily.   tamsulosin (FLOMAX) 0.4 MG CAPS capsule Take 1 capsule (0.4 mg total) by mouth daily.   traZODone (DESYREL) 50 MG tablet TAKE 1/2 TO 1 TABLET BY MOUTH EVERY NIGHT AT BEDTIME AS NEEDED FOR SLEEP   No facility-administered encounter  medications on file as of 11/11/2023.    Past Medical History:  Diagnosis Date   Arthritis    "n shoulders'   Depression    Headache    Hypertension 2014   "Dr. Lennice Sites dx patient, pt is no longer on medicine.   Seizures (HCC) onset in 2006    first seizure occurred after trip to [place] in 2006 for work where he contracted malaria    Past Surgical History:  Procedure Laterality Date   BACK SURGERY     in 1992   COLONOSCOPY WITH PROPOFOL N/A 09/13/2023   Procedure: COLONOSCOPY WITH PROPOFOL;  Surgeon: Wyline Mood, MD;  Location: [facility] ENDOSCOPY;  Service: Gastroenterology;  Laterality: N/A;   POLYPECTOMY  09/13/2023   Procedure: POLYPECTOMY;  Surgeon: Wyline Mood, MD;  Location: [facility] ENDOSCOPY;  Service: Gastroenterology;;   SHOULDER SURGERY Right    first surgery was arounf 12 years ago, second surgery in 2007   SHOULDER SURGERY Right     Family History  Problem Relation Age of Onset   Hypertension Mother    CVA Maternal Aunt 69       cause of death   Hypertension Maternal Grandmother    Heart disease Maternal Grandmother    Heart attack Maternal Grandmother 32       cause of death    Social History   Socioeconomic History   Marital status: Single    Spouse name: Not on file   Number of children: Not on file   Years of education: Not  on file   Highest education level: Associate degree: occupational, technical, or vocational program  Occupational History   Not on file  Tobacco Use   Smoking status: Every Day    Current packs/day: 1.00    Average packs/day: 1 pack/day for 27.0 years (27.0 ttl pk-yrs)    Types: Cigarettes   Smokeless tobacco: Not on file  Vaping Use   Vaping status: Never Used  Substance and Sexual Activity   Alcohol use: Not Currently    Alcohol/week: 3.0 - 4.0 standard drinks of alcohol    Types: 3 - 4 Standard drinks or equivalent per week   Drug use: Yes    Types: Marijuana   Sexual activity: Yes  Other Topics Concern   Not on file  Social  History Narrative   Not on file   Social Determinants of Health   Financial Resource Strain: Low Risk  (04/01/2023)   Overall Financial Resource Strain (CARDIA)    Difficulty of Paying Living Expenses: Not hard at all  Food Insecurity: No Food Insecurity (04/01/2023)   Hunger Vital Sign    Worried About Running Out of Food in the Last Year: Never true    Ran Out of Food in the Last Year: Never true  Transportation Needs: No Transportation Needs (04/01/2023)   PRAPARE - Administrator, Civil Service (Medical): No    Lack of Transportation (Non-Medical): No  Physical Activity: Sufficiently Active (04/01/2023)   Exercise Vital Sign    Days of Exercise per Week: 7 days    Minutes of Exercise per Session: 150+ min  Stress: No Stress Concern Present (04/01/2023)   [employer] of Occupational Health - Occupational Stress Questionnaire    Feeling of Stress : Not at all  Social Connections: Unknown (04/01/2023)   Social Connection and Isolation Panel [NHANES]    Frequency of Communication with Friends and Family: More than three times a week    Frequency of Social Gatherings with Friends and Family: Three times a week    Attends Religious Services: Never    Active Member of Clubs or Organizations: No    Attends Engineer, structural: Not on file    Marital Status: Not on file  Intimate Partner Violence: Not on file    Review of Systems  Constitutional:  Negative for chills and fever.  Eyes:  Negative for blurred vision.  Respiratory:  Negative for shortness of breath.   Cardiovascular:  Negative for chest pain.  Neurological:  Negative for headaches.  Psychiatric/Behavioral:  The patient has insomnia.         Objective    There were no vitals taken for this visit.  Physical Exam Constitutional:      Appearance: Normal appearance.  HENT:     Head: Normocephalic and atraumatic.  Eyes:     Conjunctiva/sclera: Conjunctivae normal.  Cardiovascular:      Rate and Rhythm: Normal rate and regular rhythm.  Pulmonary:     Effort: Pulmonary effort is normal.     Breath sounds: Normal breath sounds.  Skin:    General: Skin is warm and dry.  Neurological:     General: No focal deficit present.     Mental Status: He is alert. Mental status is at baseline.  Psychiatric:        Mood and Affect: Mood normal.        Behavior: Behavior normal.       Assessment & Plan:   1. Hypertension, unspecified type: Blood  pressure stable here today, no changes made to medications.  2. Insomnia, unspecified type: Stable, doing well with Trazodone PRN.  3. Benign prostatic hyperplasia with incomplete bladder emptying: Will check PSA today, if normal will prescribe Flomax.   - PSA  4. Encounter for tobacco use cessation counseling: Discussed at length, will prescribe Wellbutrin and follow up in 6 weeks.   - buPROPion (WELLBUTRIN XL) 150 MG 24 hr tablet; Take 1 tablet (150 mg total) by mouth daily.  Dispense: 30 tablet; Refill: 1   No follow-ups on file.   Margarita Mail, DO

## 2023-11-11 ENCOUNTER — Ambulatory Visit: Payer: Medicaid Other | Admitting: Internal Medicine

## 2023-11-28 ENCOUNTER — Telehealth: Payer: Self-pay | Admitting: Gastroenterology

## 2023-11-28 NOTE — Telephone Encounter (Signed)
The patient called back in I inform him that [facility] is trying to reach him. I gave him the number to reach them.

## 2023-11-28 NOTE — Telephone Encounter (Signed)
Tazmen called in from [facility] needing a another contact number for the patient.

## 2024-01-18 ENCOUNTER — Other Ambulatory Visit: Payer: Self-pay | Admitting: Internal Medicine

## 2024-01-18 DIAGNOSIS — I10 Essential (primary) hypertension: Secondary | ICD-10-CM

## 2024-01-20 NOTE — Telephone Encounter (Signed)
 Requested Prescriptions  Pending Prescriptions Disp Refills   lisinopril  (ZESTRIL ) 40 MG tablet [Pharmacy Med Name: LISINOPRIL  40 MG TABLET] 90 tablet 0    Sig: TAKE 1 TABLET BY MOUTH DAILY     Cardiovascular:  ACE Inhibitors Failed - 01/20/2024 12:18 PM      Failed - Last BP in normal range    BP Readings from Last 1 Encounters:  10/31/23 (!) 171/99         Passed - Cr in normal range and within 180 days    Creat  Date Value Ref Range Status  03/08/2023 0.96 0.70 - 1.30 mg/dL Final   Creatinine, Ser  Date Value Ref Range Status  07/25/2023 1.19 0.76 - 1.27 mg/dL Final         Passed - K in normal range and within 180 days    Potassium  Date Value Ref Range Status  07/25/2023 4.9 3.5 - 5.2 mmol/L Final  01/28/2013 4.4 3.5 - 5.1 mmol/L Final         Passed - Patient is not pregnant      Passed - Valid encounter within last 6 months    Recent Outpatient Visits           3 months ago Hypertension, unspecified type   [facility] Rockney Cid, DO   7 months ago Hypertension, unspecified type   [facility] Rockney Cid, DO   8 months ago Hypertension, unspecified type   [facility] Rockney Cid, DO   9 months ago Hypertension, unspecified type   [facility] Rockney Cid, DO   10 months ago Acute hepatitis C virus infection without hepatic coma   [facility] Rockney Cid, [place]

## 2024-01-26 ENCOUNTER — Other Ambulatory Visit: Payer: Self-pay | Admitting: Internal Medicine

## 2024-01-26 DIAGNOSIS — N401 Enlarged prostate with lower urinary tract symptoms: Secondary | ICD-10-CM

## 2024-01-27 NOTE — Telephone Encounter (Signed)
 Requested Prescriptions  Pending Prescriptions Disp Refills   tamsulosin (FLOMAX) 0.4 MG CAPS capsule [Pharmacy Med Name: TAMSULOSIN HCL 0.4 MG CAPSULE] 90 capsule 1    Sig: TAKE 1 CAPSULE BY MOUTH DAILY     Urology: Alpha-Adrenergic Blocker Failed - 01/27/2024  4:27 PM      Failed - Last BP in normal range    BP Readings from Last 1 Encounters:  10/31/23 (!) 171/99         Passed - PSA in normal range and within 360 days    PSA  Date Value Ref Range Status  09/27/2023 1.77 < OR = 4.00 ng/mL Final    Comment:    The total PSA value from this assay system is  standardized against the WHO standard. The test  result will be approximately 20% lower when compared  to the equimolar-standardized total PSA (Beckman  Coulter). Comparison of serial PSA results should be  interpreted with this fact in mind. . This test was performed using the Siemens  chemiluminescent method. Values obtained from  different assay methods cannot be used interchangeably. PSA levels, regardless of value, should not be interpreted as absolute evidence of the presence or absence of disease.    Prostate Specific Ag, Serum  Date Value Ref Range Status  11/10/2015 2.6 0.0 - 4.0 ng/mL Final    Comment:    Roche ECLIA methodology. According to the American Urological Association, Serum PSA should decrease and remain at undetectable levels after radical prostatectomy. The AUA defines biochemical recurrence as an initial PSA value 0.2 ng/mL or greater followed by a subsequent confirmatory PSA value 0.2 ng/mL or greater. Values obtained with different assay methods or kits cannot be used interchangeably. Results cannot be interpreted as absolute evidence of the presence or absence of malignant disease.          Passed - Valid encounter within last 12 months    Recent Outpatient Visits           4 months ago Hypertension, unspecified type   [facility] Margarita Mail, DO   7  months ago Hypertension, unspecified type   [facility] Margarita Mail, DO   8 months ago Hypertension, unspecified type   [facility] Margarita Mail, DO   9 months ago Hypertension, unspecified type   [facility] Margarita Mail, DO   10 months ago Acute hepatitis C virus infection without hepatic coma   [facility] Margarita Mail, [place]

## 2024-03-07 ENCOUNTER — Other Ambulatory Visit: Payer: Self-pay | Admitting: Internal Medicine

## 2024-03-07 DIAGNOSIS — G4700 Insomnia, unspecified: Secondary | ICD-10-CM

## 2024-03-09 ENCOUNTER — Other Ambulatory Visit: Payer: Self-pay | Admitting: Internal Medicine

## 2024-03-09 DIAGNOSIS — I10 Essential (primary) hypertension: Secondary | ICD-10-CM

## 2024-03-10 NOTE — Telephone Encounter (Signed)
 Requested by interface surescripts. Overdue encounter. Courtesy refill . Called patient to schedule appt for refills, no answer, LVTCB.  Requested Prescriptions  Pending Prescriptions Disp Refills   traZODone (DESYREL) 50 MG tablet [Pharmacy Med Name: traZODone 50 MG TABLET] 30 tablet 0    Sig: TAKE 1/2 TO 1 TABLET BY MOUTH EVERY NIGHT AT BEDTIME AS NEEDED FOR SLEEP     Psychiatry: Antidepressants - Serotonin Modulator Failed - 03/10/2024 11:32 AM      Failed - Valid encounter within last 6 months    Recent Outpatient Visits   None

## 2024-03-10 NOTE — Telephone Encounter (Signed)
 Called patient to scheduled appt for medication refills. No answer, LVMTCB [phone].

## 2024-03-11 ENCOUNTER — Telehealth: Payer: Self-pay | Admitting: Gastroenterology

## 2024-03-11 NOTE — Telephone Encounter (Signed)
 The patient called in and left a voicemail stating that he received a MyChart message indicating he needs to schedule a follow-up appointment and requested a callback. I returned his call to confirm we received his message and assured him we were scheduling the appointment. His follow-up appointment is scheduled for 04/21/2024 at 3:15 pm with Dr. Tobi Bastos.

## 2024-03-11 NOTE — Telephone Encounter (Signed)
 Requested Prescriptions  Pending Prescriptions Disp Refills   hydrochlorothiazide (MICROZIDE) 12.5 MG capsule [Pharmacy Med Name: hydroCHLOROthiazide 12.5 MG CAPSULE] 30 capsule 2    Sig: TAKE 1 CAPSULE BY MOUTH DAILY     Cardiovascular: Diuretics - Thiazide Failed - 03/11/2024  3:06 PM      Failed - Cr in normal range and within 180 days    Creat  Date Value Ref Range Status  03/08/2023 0.96 0.70 - 1.30 mg/dL Final   Creatinine, Ser  Date Value Ref Range Status  07/25/2023 1.19 0.76 - 1.27 mg/dL Final         Failed - K in normal range and within 180 days    Potassium  Date Value Ref Range Status  07/25/2023 4.9 3.5 - 5.2 mmol/L Final  01/28/2013 4.4 3.5 - 5.1 mmol/L Final         Failed - Na in normal range and within 180 days    Sodium  Date Value Ref Range Status  07/25/2023 137 134 - 144 mmol/L Final  01/28/2013 142 136 - 145 mmol/L Final         Failed - Last BP in normal range    BP Readings from Last 1 Encounters:  10/31/23 (!) 171/99         Failed - Valid encounter within last 6 months    Recent Outpatient Visits   None     Future Appointments             In 1 month Wyline Mood, MD [facility] at [facility]

## 2024-03-20 ENCOUNTER — Other Ambulatory Visit: Payer: Self-pay

## 2024-03-20 ENCOUNTER — Ambulatory Visit: Admitting: Internal Medicine

## 2024-03-20 ENCOUNTER — Encounter: Payer: Self-pay | Admitting: Internal Medicine

## 2024-03-20 VITALS — BP 124/72 | HR 100 | Temp 97.9°F | Resp 16 | Ht 69.5 in | Wt 191.6 lb

## 2024-03-20 DIAGNOSIS — G4700 Insomnia, unspecified: Secondary | ICD-10-CM | POA: Insufficient documentation

## 2024-03-20 DIAGNOSIS — I10 Essential (primary) hypertension: Secondary | ICD-10-CM

## 2024-03-20 MED ORDER — LISINOPRIL 40 MG PO TABS
40.0000 mg | ORAL_TABLET | Freq: Every day | ORAL | 1 refills | Status: DC
Start: 2024-03-20 — End: 2024-09-18

## 2024-03-20 MED ORDER — TRAZODONE HCL 100 MG PO TABS
100.0000 mg | ORAL_TABLET | Freq: Every day | ORAL | 1 refills | Status: DC
Start: 2024-03-20 — End: 2024-09-18

## 2024-03-20 MED ORDER — HYDROCHLOROTHIAZIDE 12.5 MG PO CAPS
12.5000 mg | ORAL_CAPSULE | Freq: Every day | ORAL | 1 refills | Status: DC
Start: 2024-03-20 — End: 2024-09-18

## 2024-03-20 NOTE — Assessment & Plan Note (Signed)
 Blood pressure stable here today, no changes made to medications and appropriate refills sent to pharmacy.

## 2024-03-20 NOTE — Assessment & Plan Note (Signed)
Increase Trazodone to 100mg at bedtime.

## 2024-03-20 NOTE — Progress Notes (Signed)
 Established Patient Office Visit  Subjective    Patient ID: Juan Avila, male    DOB: May 09, 1971  Age: 53 y.o. MRN: [id number]  CC:  Chief Complaint  Patient presents with   Medical Management of Chronic Issues    6 month follow up    HPI Juan Avila presents to follow up on chronic medical conditions.   Hypertension: -Medications: HCTZ 12.5 mg, Lisinopril 40 mg  -Checking BP at home (average): not checking -Denies any SOB, CP, vision changes, LE edema or symptoms of hypotension.   History of Seizures: -Due to malaria infection in 2006 -Not on any medication currently, no seizure activity since 2006  Hepatitis C/Alcohol Use Disorder: -Had been drinking 8 beers a day, cut down from 30 daily but today he states he hasn't had anything to drink since May 2024 -LFT's elevated on labs in March, hepatitis C viral load elevated, seeing GI in August -Following with GI, completed treatment and has follow up scheduled for May  Insomnia: -Currently on Trazodone 50 mg at bedtime, states he can fall asleep with it but he will wake up after 4-6 hours.   Health Maintenance: -Blood work UTD -Colon cancer screening: colonoscopy 10/24, repeat in 1 year -Lung cancer screening: 8/24 LUNG-RADS 2 -Discussed Prevnar 20 vaccine, declined today  Outpatient Encounter Medications as of 03/20/2024  Medication Sig   hydrochlorothiazide (MICROZIDE) 12.5 MG capsule TAKE 1 CAPSULE BY MOUTH DAILY   lisinopril (ZESTRIL) 40 MG tablet TAKE 1 TABLET BY MOUTH DAILY   tamsulosin (FLOMAX) 0.4 MG CAPS capsule TAKE 1 CAPSULE BY MOUTH DAILY   traZODone (DESYREL) 50 MG tablet TAKE 1/2 TO 1 TABLET BY MOUTH EVERY NIGHT AT BEDTIME AS NEEDED FOR SLEEP   buPROPion (WELLBUTRIN XL) 150 MG 24 hr tablet Take 1 tablet (150 mg total) by mouth daily. (Patient not taking: Reported on 03/20/2024)   No facility-administered encounter medications on file as of 03/20/2024.    Past Medical History:  Diagnosis Date    Arthritis    "n shoulders'   Depression    Headache    Hypertension 2014   "Dr. Lennice Sites dx patient, pt is no longer on medicine.   Seizures (HCC) onset in 2006    first seizure occurred after trip to [place] in 2006 for work where he contracted malaria    Past Surgical History:  Procedure Laterality Date   BACK SURGERY     in 1992   COLONOSCOPY WITH PROPOFOL N/A 09/13/2023   Procedure: COLONOSCOPY WITH PROPOFOL;  Surgeon: Wyline Mood, MD;  Location: [facility] ENDOSCOPY;  Service: Gastroenterology;  Laterality: N/A;   POLYPECTOMY  09/13/2023   Procedure: POLYPECTOMY;  Surgeon: Wyline Mood, MD;  Location: [facility] ENDOSCOPY;  Service: Gastroenterology;;   SHOULDER SURGERY Right    first surgery was arounf 12 years ago, second surgery in 2007   SHOULDER SURGERY Right     Family History  Problem Relation Age of Onset   Hypertension Mother    CVA Maternal Aunt 32       cause of death   Hypertension Maternal Grandmother    Heart disease Maternal Grandmother    Heart attack Maternal Grandmother 32       cause of death    Social History   Socioeconomic History   Marital status: Single    Spouse name: Not on file   Number of children: Not on file   Years of education: Not on file   Highest education level: Associate degree: occupational,  technical, or vocational program  Occupational History   Not on file  Tobacco Use   Smoking status: Every Day    Current packs/day: 1.00    Average packs/day: 1 pack/day for 27.0 years (27.0 ttl pk-yrs)    Types: Cigarettes   Smokeless tobacco: Not on file  Vaping Use   Vaping status: Never Used  Substance and Sexual Activity   Alcohol use: Not Currently    Alcohol/week: 3.0 - 4.0 standard drinks of alcohol    Types: 3 - 4 Standard drinks or equivalent per week   Drug use: Yes    Types: Marijuana   Sexual activity: Yes  Other Topics Concern   Not on file  Social History Narrative   Not on file   Social Drivers of Health   Financial Resource  Strain: Low Risk  (04/01/2023)   Overall Financial Resource Strain (CARDIA)    Difficulty of Paying Living Expenses: Not hard at all  Food Insecurity: No Food Insecurity (04/01/2023)   Hunger Vital Sign    Worried About Running Out of Food in the Last Year: Never true    Ran Out of Food in the Last Year: Never true  Transportation Needs: No Transportation Needs (04/01/2023)   PRAPARE - Administrator, Civil Service (Medical): No    Lack of Transportation (Non-Medical): No  Physical Activity: Sufficiently Active (04/01/2023)   Exercise Vital Sign    Days of Exercise per Week: 7 days    Minutes of Exercise per Session: 150+ min  Stress: No Stress Concern Present (04/01/2023)   [employer] of Occupational Health - Occupational Stress Questionnaire    Feeling of Stress : Not at all  Social Connections: Unknown (04/01/2023)   Social Connection and Isolation Panel [NHANES]    Frequency of Communication with Friends and Family: More than three times a week    Frequency of Social Gatherings with Friends and Family: Three times a week    Attends Religious Services: Never    Active Member of Clubs or Organizations: No    Attends Engineer, structural: Not on file    Marital Status: Not on file  Intimate Partner Violence: Not on file    Review of Systems  Constitutional:  Negative for chills and fever.  Eyes:  Negative for blurred vision.  Respiratory:  Negative for shortness of breath.   Cardiovascular:  Negative for chest pain.  Neurological:  Negative for headaches.  Psychiatric/Behavioral:  The patient has insomnia.         Objective    BP 124/72 (Cuff Size: Large)   Pulse 100   Temp 97.9 F (36.6 C) (Oral)   Resp 16   Ht 5' 9.5" (1.765 m)   Wt 191 lb 9.6 oz (86.9 kg)   SpO2 97%   BMI 27.89 kg/m   Physical Exam Constitutional:      Appearance: Normal appearance.  HENT:     Head: Normocephalic and atraumatic.     Mouth/Throat:     Mouth: Mucous  membranes are moist.     Pharynx: Oropharynx is clear.  Eyes:     Extraocular Movements: Extraocular movements intact.     Conjunctiva/sclera: Conjunctivae normal.     Pupils: Pupils are equal, round, and reactive to light.  Neck:     Comments: No thyromegaly  Cardiovascular:     Rate and Rhythm: Normal rate and regular rhythm.  Pulmonary:     Effort: Pulmonary effort is normal.  Breath sounds: Normal breath sounds.  Musculoskeletal:     Cervical back: No tenderness.     Right lower leg: No edema.     Left lower leg: No edema.  Lymphadenopathy:     Cervical: No cervical adenopathy.  Skin:    General: Skin is warm and dry.  Neurological:     General: No focal deficit present.     Mental Status: He is alert. Mental status is at baseline.  Psychiatric:        Mood and Affect: Mood normal.        Behavior: Behavior normal.       Assessment & Plan:   Hypertension, unspecified type Assessment & Plan: Blood pressure stable here today, no changes made to medications and appropriate refills sent to pharmacy.    Orders: -     hydroCHLOROthiazide; Take 1 capsule (12.5 mg total) by mouth daily.  Dispense: 90 capsule; Refill: 1 -     Lisinopril; Take 1 tablet (40 mg total) by mouth daily.  Dispense: 90 tablet; Refill: 1  Insomnia, unspecified type Assessment & Plan: Increase Trazodone to 100 mg at bedtime.   Orders: -     traZODone HCl; Take 1 tablet (100 mg total) by mouth at bedtime.  Dispense: 90 tablet; Refill: 1     Return in about 6 months (around 09/19/2024).   Margarita Mail, DO

## 2024-04-07 ENCOUNTER — Other Ambulatory Visit: Payer: Self-pay | Admitting: Internal Medicine

## 2024-04-07 DIAGNOSIS — G4700 Insomnia, unspecified: Secondary | ICD-10-CM

## 2024-04-10 NOTE — Telephone Encounter (Signed)
 Discontinued on 03/20/24, dose change.  Requested Prescriptions  Pending Prescriptions Disp Refills   traZODone  (DESYREL ) 50 MG tablet [Pharmacy Med Name: traZODone  50 MG TABLET] 30 tablet 0    Sig: TAKE 1/2 - 1 TABLET BY MOUTH AT BEDTIME AS NEEDED FOR SLEEP     Psychiatry: Antidepressants - Serotonin Modulator Failed - 04/10/2024 12:38 PM      Failed - Valid encounter within last 6 months    Recent Outpatient Visits           3 weeks ago Hypertension, unspecified type   [facility] Rockney Cid, DO       Future Appointments             In 1 week Luke Salaam, MD [facility]  Gastroenterology at [facility]

## 2024-04-20 ENCOUNTER — Telehealth: Payer: Self-pay

## 2024-04-20 DIAGNOSIS — B182 Chronic viral hepatitis C: Secondary | ICD-10-CM

## 2024-04-20 NOTE — Telephone Encounter (Signed)
 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 He was also informed to call [facility] in June to schedule a follow up appointment with Dr. Antony Baumgartner. Phone number was provided.

## 2024-04-20 NOTE — Telephone Encounter (Signed)
 Patient called to let us  know that he started taking his medication in January and finished them in March. He stated that he had finished his 90 days treatment. He also stated that he was not aware of the labs that needed to be drawn. But he stated that he called his PCP's office and [facility] and that nobody knew about the labs. I told him that the labs have been ordered since November and that I also ordered them again for him to have them drawn in order for Dr. Antony Baumgartner to go by. Patient stated that he could go and have them done but he wants to know if he will be seen tomorrow. Please advise.

## 2024-04-21 ENCOUNTER — Ambulatory Visit: Admitting: Gastroenterology

## 2024-07-06 ENCOUNTER — Encounter: Payer: Self-pay | Admitting: Acute Care

## 2024-08-20 ENCOUNTER — Other Ambulatory Visit: Payer: Self-pay | Admitting: Acute Care

## 2024-08-20 DIAGNOSIS — Z87891 Personal history of nicotine dependence: Secondary | ICD-10-CM

## 2024-08-20 DIAGNOSIS — F17210 Nicotine dependence, cigarettes, uncomplicated: Secondary | ICD-10-CM

## 2024-08-20 DIAGNOSIS — Z122 Encounter for screening for malignant neoplasm of respiratory organs: Secondary | ICD-10-CM

## 2024-08-28 ENCOUNTER — Ambulatory Visit
Admission: RE | Admit: 2024-08-28 | Discharge: 2024-08-28 | Disposition: A | Discharge status: Home / Self Care | Source: Ambulatory Visit | Attending: Acute Care | Admitting: Acute Care

## 2024-08-28 DIAGNOSIS — Z87891 Personal history of nicotine dependence: Secondary | ICD-10-CM | POA: Diagnosis not present

## 2024-08-28 DIAGNOSIS — F17210 Nicotine dependence, cigarettes, uncomplicated: Secondary | ICD-10-CM | POA: Diagnosis not present

## 2024-08-28 DIAGNOSIS — Z122 Encounter for screening for malignant neoplasm of respiratory organs: Secondary | ICD-10-CM | POA: Insufficient documentation

## 2024-09-08 ENCOUNTER — Other Ambulatory Visit: Payer: Self-pay

## 2024-09-08 DIAGNOSIS — Z122 Encounter for screening for malignant neoplasm of respiratory organs: Secondary | ICD-10-CM

## 2024-09-08 DIAGNOSIS — F17210 Nicotine dependence, cigarettes, uncomplicated: Secondary | ICD-10-CM

## 2024-09-08 DIAGNOSIS — Z87891 Personal history of nicotine dependence: Secondary | ICD-10-CM

## 2024-09-18 ENCOUNTER — Other Ambulatory Visit: Payer: Self-pay

## 2024-09-18 ENCOUNTER — Encounter: Payer: Self-pay | Admitting: Internal Medicine

## 2024-09-18 ENCOUNTER — Ambulatory Visit: Admitting: Internal Medicine

## 2024-09-18 VITALS — BP 134/78 | HR 58 | Temp 98.2°F | Resp 16 | Ht 69.5 in | Wt 184.4 lb

## 2024-09-18 DIAGNOSIS — I10 Essential (primary) hypertension: Secondary | ICD-10-CM

## 2024-09-18 DIAGNOSIS — G4700 Insomnia, unspecified: Secondary | ICD-10-CM

## 2024-09-18 DIAGNOSIS — B1920 Unspecified viral hepatitis C without hepatic coma: Secondary | ICD-10-CM | POA: Diagnosis not present

## 2024-09-18 DIAGNOSIS — N401 Enlarged prostate with lower urinary tract symptoms: Secondary | ICD-10-CM

## 2024-09-18 DIAGNOSIS — R3914 Feeling of incomplete bladder emptying: Secondary | ICD-10-CM

## 2024-09-18 DIAGNOSIS — Z13220 Encounter for screening for lipoid disorders: Secondary | ICD-10-CM

## 2024-09-18 DIAGNOSIS — Z125 Encounter for screening for malignant neoplasm of prostate: Secondary | ICD-10-CM

## 2024-09-18 MED ORDER — HYDROCHLOROTHIAZIDE 12.5 MG PO CAPS: 12.5000 mg | ORAL_CAPSULE | Freq: Every day | ORAL | 1 refills | Status: AC

## 2024-09-18 MED ORDER — LISINOPRIL 40 MG PO TABS: 40.0000 mg | ORAL_TABLET | Freq: Every day | ORAL | 1 refills | Status: AC

## 2024-09-18 MED ORDER — TAMSULOSIN HCL 0.4 MG PO CAPS
0.4000 mg | ORAL_CAPSULE | Freq: Every day | ORAL | 1 refills | Status: AC
Start: 2024-09-18 — End: ?

## 2024-09-18 MED ORDER — TRAZODONE HCL 100 MG PO TABS: 100.0000 mg | ORAL_TABLET | Freq: Every day | ORAL | 1 refills | Status: AC

## 2024-09-18 NOTE — Progress Notes (Signed)
 Established Patient Office Visit  Subjective    Patient ID: Juan Avila, male    DOB: [birth year]  Age: 53 y.o. MRN: [id number]  CC:  Chief Complaint  Patient presents with   Medical Management of Chronic Issues    6 month recheck    HPI Juan Avila presents to follow up on chronic medical conditions.   Discussed the use of AI scribe software for clinical note transcription with the patient, who gave verbal consent to proceed.  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  He completed a three-month course of antiviral medication for hepatitis C, which was costly. He is uncertain about the need for ongoing therapy and awaits further evaluation. His current medications include hydrochlorothiazide  and lisinopril  for hypertension, taken regularly. He uses trazodone  as needed for sleep and has about fifteen tablets remaining. He has run out of Flomax , which alleviated urinary symptoms, and seeks a refill.   Hypertension: -Medications: HCTZ 12.5 mg, Lisinopril  40 mg  -Checking BP at home (average): not checking -Denies any SOB, CP, vision changes, LE edema or symptoms of hypotension.   History of Seizures: -Due to malaria infection in 2006 -Not on any medication currently, no seizure activity since 2006  Hepatitis C/Alcohol Use Disorder: -LFT's elevated on labs in March, hepatitis C viral load elevated -Had been following with GI, completed treatment but never had repeat labs after treatment  Insomnia: -Currently on Trazodone  100 mg at bedtime, states he can fall asleep with it but he will wake up after 4-6 hours.   Health Maintenance: -Blood work due -Colon cancer screening: colonoscopy 10/24, repeat in 1 year -Lung cancer screening: 8/24 LUNG-RADS 2   Outpatient Encounter Medications as of 09/18/2024  Medication Sig   hydrochlorothiazide  (MICROZIDE ) 12.5 MG capsule Take 1  capsule (12.5 mg total) by mouth daily.   lisinopril  (ZESTRIL ) 40 MG tablet Take 1 tablet (40 mg total) by mouth daily.   tamsulosin  (FLOMAX ) 0.4 MG CAPS capsule TAKE 1 CAPSULE BY MOUTH DAILY   traZODone  (DESYREL ) 100 MG tablet Take 1 tablet (100 mg total) by mouth at bedtime.   No facility-administered encounter medications on file as of 09/18/2024.    Past Medical History:  Diagnosis Date   Arthritis    n shoulders'   Depression    Headache    Hypertension 2014   Dr. Niki dx patient, pt is no longer on medicine.   Seizures (HCC) onset in 2006    first seizure occurred after trip to [place] in 2006 for work where he contracted malaria    Past Surgical History:  Procedure Laterality Date   BACK SURGERY     in 1992   COLONOSCOPY WITH PROPOFOL  N/A 09/13/2023   Procedure: COLONOSCOPY WITH PROPOFOL ;  Surgeon: Therisa Bi, MD;  Location: [facility] ENDOSCOPY;  Service: Gastroenterology;  Laterality: N/A;   POLYPECTOMY  09/13/2023   Procedure: POLYPECTOMY;  Surgeon: Therisa Bi, MD;  Location: [facility] ENDOSCOPY;  Service: Gastroenterology;;   SHOULDER SURGERY Right    first surgery was arounf 12 years ago, second surgery in 2007   SHOULDER SURGERY Right     Family History  Problem Relation Age of Onset   Hypertension Mother    CVA Maternal Aunt 41       cause of death   Hypertension Maternal Grandmother    Heart disease Maternal Grandmother    Heart attack Maternal  Grandmother 32       cause of death    Social History   Socioeconomic History   Marital status: Single    Spouse name: Not on file   Number of children: Not on file   Years of education: Not on file   Highest education level: Associate degree: occupational, Scientist, product/process development, or vocational program  Occupational History   Not on file  Tobacco Use   Smoking status: Every Day    Current packs/day: 1.00    Average packs/day: 1 pack/day for 27.0 years (27.0 ttl pk-yrs)    Types: Cigarettes   Smokeless tobacco: Not on file   Vaping Use   Vaping status: Never Used  Substance and Sexual Activity   Alcohol use: Not Currently    Alcohol/week: 3.0 - 4.0 standard drinks of alcohol    Types: 3 - 4 Standard drinks or equivalent per week   Drug use: Yes    Types: Marijuana   Sexual activity: Yes  Other Topics Concern   Not on file  Social History Narrative   Not on file   Social Drivers of Health   Financial Resource Strain: Low Risk  (04/01/2023)   Overall Financial Resource Strain (CARDIA)    Difficulty of Paying Living Expenses: Not hard at all  Food Insecurity: No Food Insecurity (04/01/2023)   Hunger Vital Sign    Worried About Running Out of Food in the Last Year: Never true    Ran Out of Food in the Last Year: Never true  Transportation Needs: No Transportation Needs (04/01/2023)   PRAPARE - Administrator, Civil Service (Medical): No    Lack of Transportation (Non-Medical): No  Physical Activity: Sufficiently Active (04/01/2023)   Exercise Vital Sign    Days of Exercise per Week: 7 days    Minutes of Exercise per Session: 150+ min  Stress: No Stress Concern Present (04/01/2023)   [employer] of Occupational Health - Occupational Stress Questionnaire    Feeling of Stress : Not at all  Social Connections: Unknown (04/01/2023)   Social Connection and Isolation Panel    Frequency of Communication with Friends and Family: More than three times a week    Frequency of Social Gatherings with Friends and Family: Three times a week    Attends Religious Services: Never    Active Member of Clubs or Organizations: No    Attends Engineer, structural: Not on file    Marital Status: Not on file  Intimate Partner Violence: Not on file    Review of Systems  All other systems reviewed and are negative.       Objective    BP 134/78 (Cuff Size: Large)   Pulse (!) 58   Temp 98.2 F (36.8 C) (Oral)   Resp 16   Ht 5' 9.5 (1.765 m)   Wt 184 lb 6.4 oz (83.6 kg)   SpO2 98%   BMI  26.84 kg/m   Physical Exam Constitutional:      Appearance: Normal appearance.  HENT:     Head: Normocephalic and atraumatic.     Mouth/Throat:     Mouth: Mucous membranes are moist.     Pharynx: Oropharynx is clear.  Eyes:     Extraocular Movements: Extraocular movements intact.     Conjunctiva/sclera: Conjunctivae normal.     Pupils: Pupils are equal, round, and reactive to light.  Neck:     Comments: No thyromegaly  Cardiovascular:     Rate and Rhythm:  Normal rate and regular rhythm.  Pulmonary:     Effort: Pulmonary effort is normal.     Breath sounds: Normal breath sounds.  Musculoskeletal:     Cervical back: No tenderness.     Right lower leg: No edema.     Left lower leg: No edema.  Lymphadenopathy:     Cervical: No cervical adenopathy.  Skin:    General: Skin is warm and dry.  Neurological:     General: No focal deficit present.     Mental Status: He is alert. Mental status is at baseline.  Psychiatric:        Mood and Affect: Mood normal.        Behavior: Behavior normal.       Assessment & Plan:   Assessment & Plan Liver fibrosis and hepatitis C infection, status post treatment Completed antiviral treatment. Further therapy needs assessment. - Order hepatitis C viral load test. - Refer back to Dr. Therisa for further evaluation.  Colonic polyps, status post removal Eleven polyps removed. Requires continued surveillance. - Schedule follow-up colonoscopy when due.  Hypertension Blood pressure controlled at 134/78 mmHg on current regimen. - Refill hydrochlorothiazide . - Refill lisinopril .  BPH Flomax  effective for symptom management. Medication needed. - Refill Flomax .  Insomnia Intermittent trazodone  use with 15 tablets remaining. - Refill trazodone .  General Health Maintenance Discussed routine health maintenance, prostate cancer screening, and shingles vaccine side effects. - Order prostate cancer screening. - Discuss potential side effects  of shingles vaccine.  - CBC w/Diff/Platelet - Comprehensive Metabolic Panel (CMET) - hydrochlorothiazide  (MICROZIDE ) 12.5 MG capsule; Take 1 capsule (12.5 mg total) by mouth daily.  Dispense: 90 capsule; Refill: 1 - lisinopril  (ZESTRIL ) 40 MG tablet; Take 1 tablet (40 mg total) by mouth daily.  Dispense: 90 tablet; Refill: 1 - Ambulatory referral to Gastroenterology - HCV-RNA, Quant Real-Time PCR w/reflex - traZODone  (DESYREL ) 100 MG tablet; Take 1 tablet (100 mg total) by mouth at bedtime.  Dispense: 90 tablet; Refill: 1 - tamsulosin  (FLOMAX ) 0.4 MG CAPS capsule; Take 1 capsule (0.4 mg total) by mouth daily.  Dispense: 90 capsule; Refill: 1 - Lipid Profile - PSA   Return in about 6 months (around 03/19/2025).   Sharyle Fischer, DO

## 2024-09-21 ENCOUNTER — Ambulatory Visit: Payer: Self-pay | Admitting: Internal Medicine

## 2024-09-21 LAB — CBC WITH DIFFERENTIAL/PLATELET
Absolute Lymphocytes: 1755 {cells}/uL (ref 850–3900)
Absolute Monocytes: 735 {cells}/uL (ref 200–950)
Basophils Absolute: 113 {cells}/uL (ref 0–200)
Basophils Relative: 1.5 %
Eosinophils Absolute: 83 {cells}/uL (ref 15–500)
Eosinophils Relative: 1.1 %
HCT: 50.2 % — ABNORMAL HIGH (ref 38.5–50.0)
Hemoglobin: 16.9 g/dL (ref 13.2–17.1)
MCH: 33.7 pg — ABNORMAL HIGH (ref 27.0–33.0)
MCHC: 33.7 g/dL (ref 32.0–36.0)
MCV: 100.2 fL — ABNORMAL HIGH (ref 80.0–100.0)
MPV: 10 fL (ref 7.5–12.5)
Monocytes Relative: 9.8 %
Neutro Abs: 4815 {cells}/uL (ref 1500–7800)
Neutrophils Relative %: 64.2 %
Platelets: 312 Thousand/uL (ref 140–400)
RBC: 5.01 Million/uL (ref 4.20–5.80)
RDW: 11.6 % (ref 11.0–15.0)
Total Lymphocyte: 23.4 %
WBC: 7.5 Thousand/uL (ref 3.8–10.8)

## 2024-09-21 LAB — COMPREHENSIVE METABOLIC PANEL WITH GFR
AG Ratio: 1.8 (calc) (ref 1.0–2.5)
ALT: 10 U/L (ref 9–46)
AST: 16 U/L (ref 10–35)
Albumin: 4.4 g/dL (ref 3.6–5.1)
Alkaline phosphatase (APISO): 78 U/L (ref 35–144)
BUN: 9 mg/dL (ref 7–25)
CO2: 30 mmol/L (ref 20–32)
Calcium: 9.7 mg/dL (ref 8.6–10.3)
Chloride: 98 mmol/L (ref 98–110)
Creat: 0.94 mg/dL (ref 0.70–1.30)
Globulin: 2.5 g/dL (ref 1.9–3.7)
Glucose, Bld: 56 mg/dL — ABNORMAL LOW (ref 65–99)
Potassium: 5 mmol/L (ref 3.5–5.3)
Sodium: 134 mmol/L — ABNORMAL LOW (ref 135–146)
Total Bilirubin: 0.7 mg/dL (ref 0.2–1.2)
Total Protein: 6.9 g/dL (ref 6.1–8.1)
eGFR: 98 mL/min/1.73m2 (ref >=60)

## 2024-09-21 LAB — PSA: PSA: 2.49 ng/mL (ref <=4.00)

## 2024-09-21 LAB — HCV RNA, QUANT REAL-TIME PCR W/REFLEX
HCV RNA, PCR, QN (Log): <1.18 {Log_IU}/mL
HCV RNA, PCR, QN: <15 [IU]/mL

## 2024-09-21 LAB — LIPID PANEL
Cholesterol: 186 mg/dL (ref <200)
HDL: 71 mg/dL (ref >=40)
LDL Cholesterol (Calc): 100 mg/dL — ABNORMAL HIGH
Non-HDL Cholesterol (Calc): 115 mg/dL (ref <130)
Total CHOL/HDL Ratio: 2.6 (calc) (ref <5.0)
Triglycerides: 67 mg/dL (ref <150)

## 2025-03-19 ENCOUNTER — Ambulatory Visit: Admitting: Internal Medicine
